# Patient Record
Sex: Female | Born: 1985 | Smoking: Never smoker
Health system: Southern US, Community
[De-identification: ages and names within clinical notes are randomized; demographics above are authoritative.]

## PROBLEM LIST (undated history)

## (undated) DIAGNOSIS — F419 Anxiety disorder, unspecified: Secondary | ICD-10-CM

## (undated) DIAGNOSIS — N809 Endometriosis, unspecified: Secondary | ICD-10-CM

## (undated) HISTORY — DX: Anxiety disorder, unspecified: F41.9

## (undated) HISTORY — DX: Endometriosis, unspecified: N80.9

## (undated) HISTORY — PX: LAPAROSCOPY: SHX197

---

## 2014-12-17 ENCOUNTER — Other Ambulatory Visit (HOSPITAL_COMMUNITY)
Admission: RE | Admit: 2014-12-17 | Discharge: 2014-12-17 | Disposition: A | Discharge status: Home / Self Care | Payer: BLUE CROSS/BLUE SHIELD | Source: Ambulatory Visit | Attending: Obstetrics & Gynecology | Admitting: Obstetrics & Gynecology

## 2014-12-17 DIAGNOSIS — Z01419 Encounter for gynecological examination (general) (routine) without abnormal findings: Secondary | ICD-10-CM | POA: Diagnosis present

## 2014-12-17 DIAGNOSIS — Z1151 Encounter for screening for human papillomavirus (HPV): Secondary | ICD-10-CM | POA: Insufficient documentation

## 2019-01-31 ENCOUNTER — Other Ambulatory Visit: Payer: Self-pay

## 2019-01-31 DIAGNOSIS — Z20822 Contact with and (suspected) exposure to covid-19: Secondary | ICD-10-CM

## 2019-02-02 LAB — NOVEL CORONAVIRUS, NAA: SARS-CoV-2, NAA: NOT DETECTED

## 2021-04-10 ENCOUNTER — Emergency Department (HOSPITAL_COMMUNITY): Payer: Self-pay

## 2021-04-10 ENCOUNTER — Emergency Department (HOSPITAL_COMMUNITY)
Admission: EM | Admit: 2021-04-10 | Discharge: 2021-04-11 | Disposition: A | Discharge status: Home / Self Care | Payer: Self-pay | Attending: Emergency Medicine | Admitting: Emergency Medicine

## 2021-04-10 ENCOUNTER — Encounter (HOSPITAL_COMMUNITY): Payer: Self-pay

## 2021-04-10 DIAGNOSIS — R739 Hyperglycemia, unspecified: Secondary | ICD-10-CM | POA: Insufficient documentation

## 2021-04-10 DIAGNOSIS — E871 Hypo-osmolality and hyponatremia: Secondary | ICD-10-CM | POA: Insufficient documentation

## 2021-04-10 DIAGNOSIS — N9489 Other specified conditions associated with female genital organs and menstrual cycle: Secondary | ICD-10-CM | POA: Insufficient documentation

## 2021-04-10 DIAGNOSIS — N949 Unspecified condition associated with female genital organs and menstrual cycle: Secondary | ICD-10-CM

## 2021-04-10 DIAGNOSIS — N23 Unspecified renal colic: Secondary | ICD-10-CM | POA: Insufficient documentation

## 2021-04-10 DIAGNOSIS — N131 Hydronephrosis with ureteral stricture, not elsewhere classified: Secondary | ICD-10-CM | POA: Insufficient documentation

## 2021-04-10 DIAGNOSIS — N838 Other noninflammatory disorders of ovary, fallopian tube and broad ligament: Secondary | ICD-10-CM | POA: Insufficient documentation

## 2021-04-10 DIAGNOSIS — R102 Pelvic and perineal pain: Secondary | ICD-10-CM

## 2021-04-10 LAB — URINALYSIS, ROUTINE W REFLEX MICROSCOPIC
Bacteria, UA: NONE SEEN
Bilirubin Urine: NEGATIVE
Glucose, UA: NEGATIVE mg/dL
Hgb urine dipstick: NEGATIVE
Ketones, ur: NEGATIVE mg/dL
Leukocytes,Ua: NEGATIVE
Nitrite: POSITIVE — AB
Protein, ur: 100 mg/dL — AB
Specific Gravity, Urine: 1.027 (ref 1.005–1.030)
pH: 5 (ref 5.0–8.0)

## 2021-04-10 LAB — CBC
HCT: 40.5 % (ref 36.0–46.0)
Hemoglobin: 13.8 g/dL (ref 12.0–15.0)
MCH: 29.9 pg (ref 26.0–34.0)
MCHC: 34.1 g/dL (ref 30.0–36.0)
MCV: 87.9 fL (ref 80.0–100.0)
Platelets: 395 10*3/uL (ref 150–400)
RBC: 4.61 MIL/uL (ref 3.87–5.11)
RDW: 11.7 % (ref 11.5–15.5)
WBC: 6.8 10*3/uL (ref 4.0–10.5)
nRBC: 0 % (ref 0.0–0.2)

## 2021-04-10 LAB — COMPREHENSIVE METABOLIC PANEL
ALT: 18 U/L (ref 0–44)
AST: 32 U/L (ref 15–41)
Albumin: 4.2 g/dL (ref 3.5–5.0)
Alkaline Phosphatase: 54 U/L (ref 38–126)
Anion gap: 10 (ref 5–15)
BUN: 13 mg/dL (ref 6–20)
CO2: 23 mmol/L (ref 22–32)
Calcium: 9.2 mg/dL (ref 8.9–10.3)
Chloride: 99 mmol/L (ref 98–111)
Creatinine, Ser: 1.14 mg/dL — ABNORMAL HIGH (ref 0.44–1.00)
GFR, Estimated: >60 mL/min (ref >60)
Glucose, Bld: 135 mg/dL — ABNORMAL HIGH (ref 70–99)
Potassium: 4.2 mmol/L (ref 3.5–5.1)
Sodium: 132 mmol/L — ABNORMAL LOW (ref 135–145)
Total Bilirubin: 1.4 mg/dL — ABNORMAL HIGH (ref 0.3–1.2)
Total Protein: 7.7 g/dL (ref 6.5–8.1)

## 2021-04-10 LAB — I-STAT BETA HCG BLOOD, ED (MC, WL, AP ONLY): I-stat hCG, quantitative: <5 m[IU]/mL (ref <5)

## 2021-04-10 IMAGING — US US PELVIS COMPLETE TRANSABD/TRANSVAG W DUPLEX AND/OR DOPPLER
1 series · 13 of 25 positions shown · non-contrast
Comparison: CT from earlier in the same day.

CLINICAL DATA: Cystic structure in the right hemipelvis on recent
CT

EXAM:
TRANSABDOMINAL AND TRANSVAGINAL ULTRASOUND OF PELVIS
DOPPLER ULTRASOUND OF OVARIES
TECHNIQUE: Both transabdominal and transvaginal ultrasound examinations of the
pelvis were performed. Transabdominal technique was performed for
global imaging of the pelvis including uterus, ovaries, adnexal
regions, and pelvic cul-de-sac.
It was necessary to proceed with endovaginal exam following the
transabdominal exam to visualize the ovaries. Color and duplex
Doppler ultrasound was utilized to evaluate blood flow to the
ovaries.

[Series 1: us pelvic doppler (torsion right/o or mass arteria · arterial · 13 of 45 slices shown]
[im 1/45]
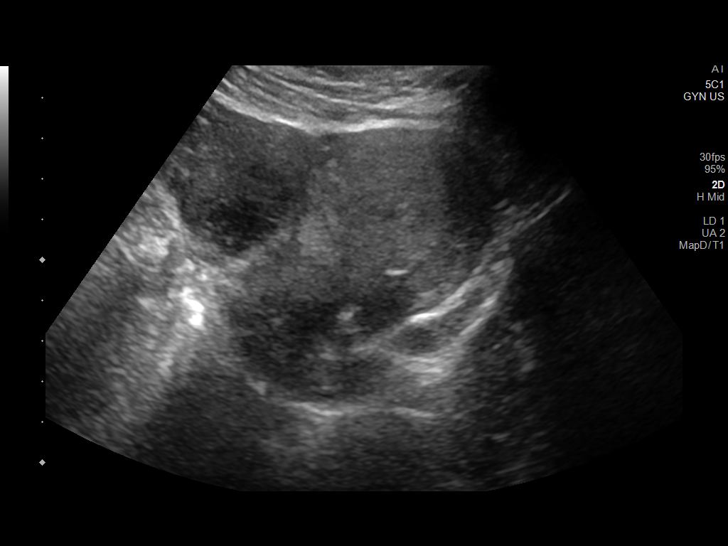
[im 4/45]
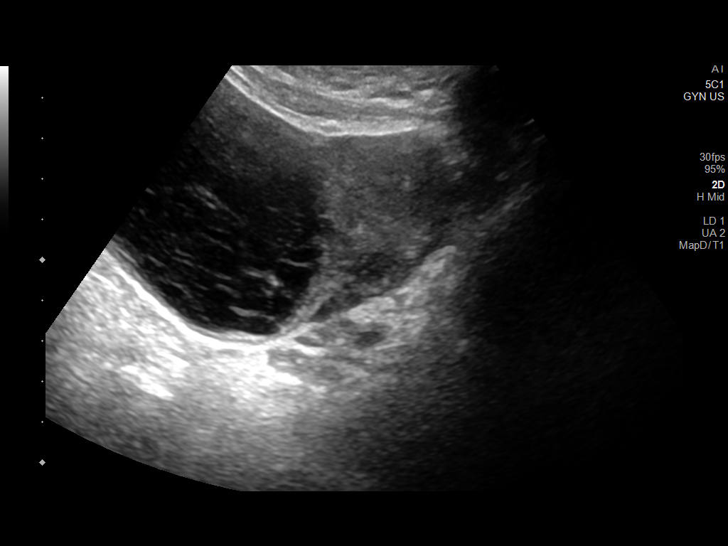
[im 8/45]
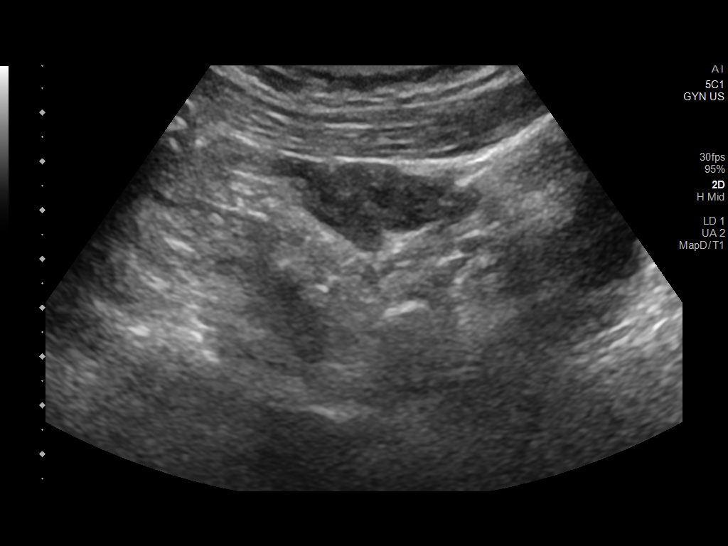
[im 12/45]
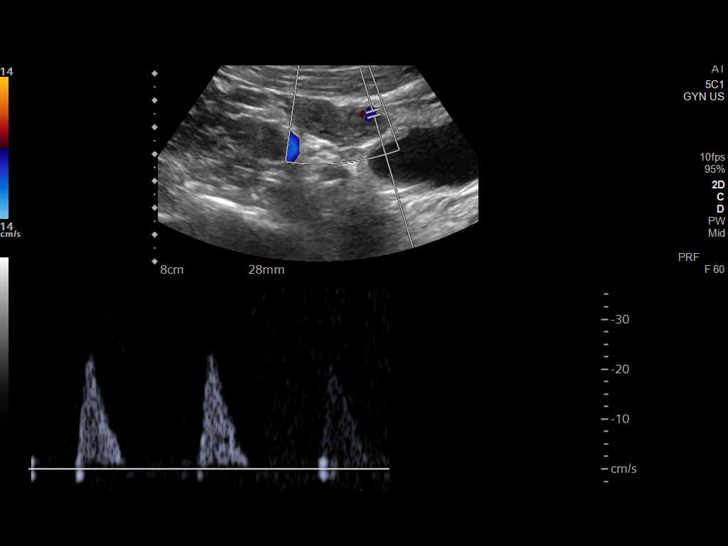
[im 15/45]
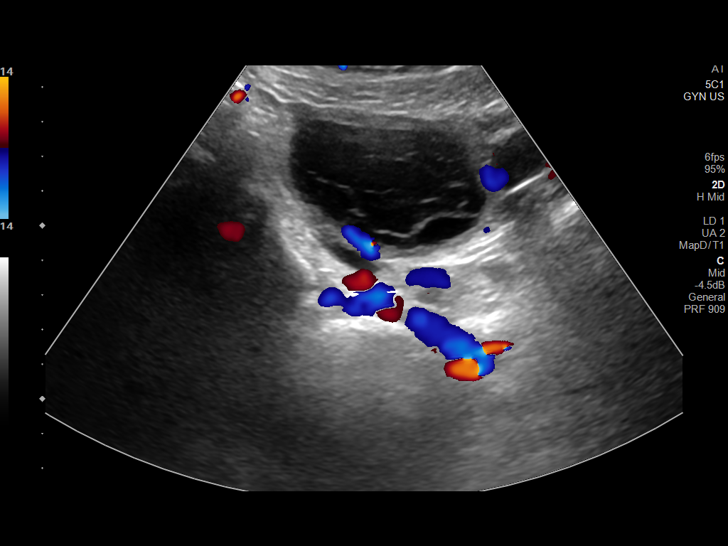
[im 19/45]
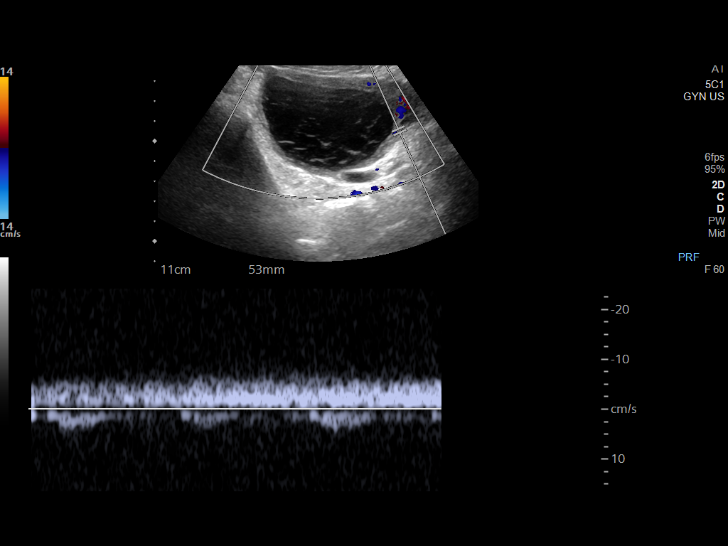
[im 23/45]
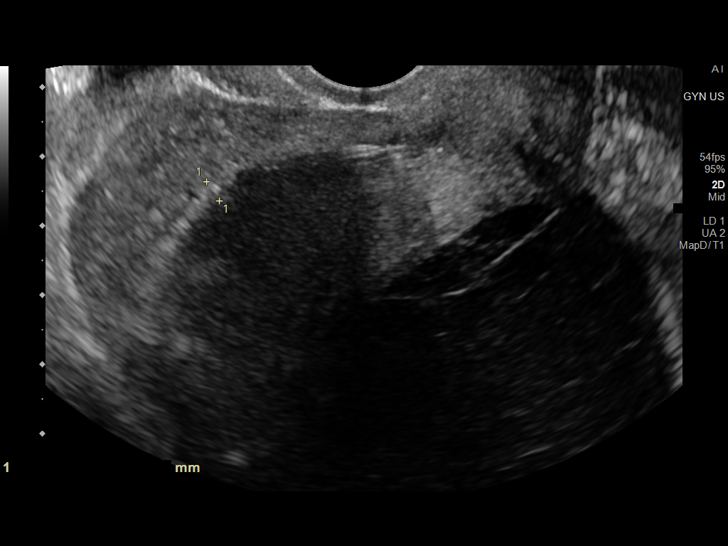
[im 26/45]
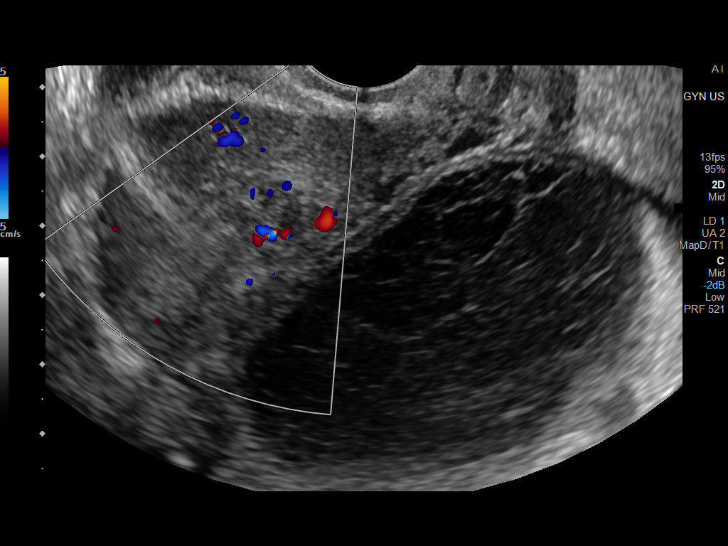
[im 30/45]
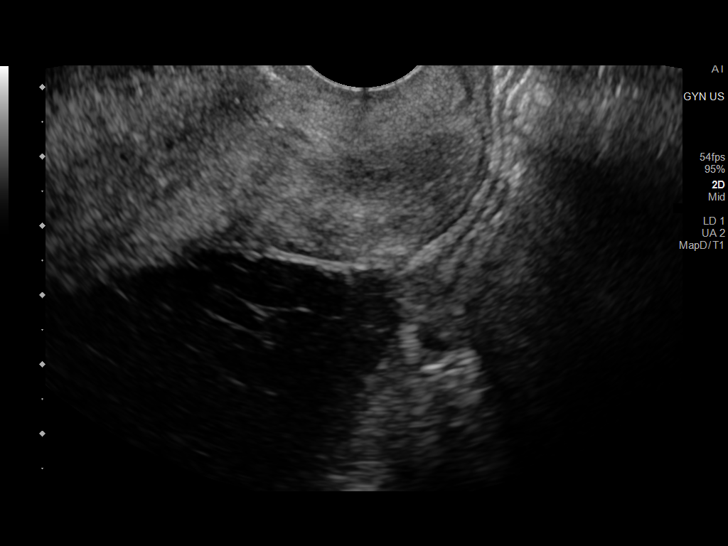
[im 34/45]
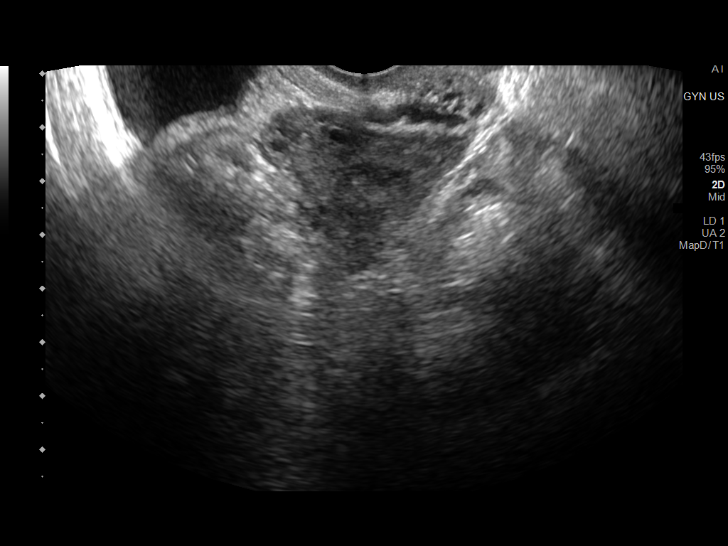
[im 37/45]
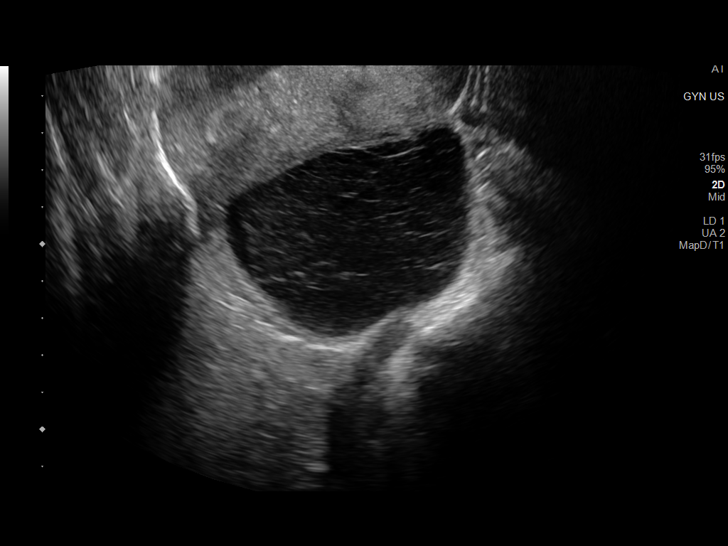
[im 41/45]
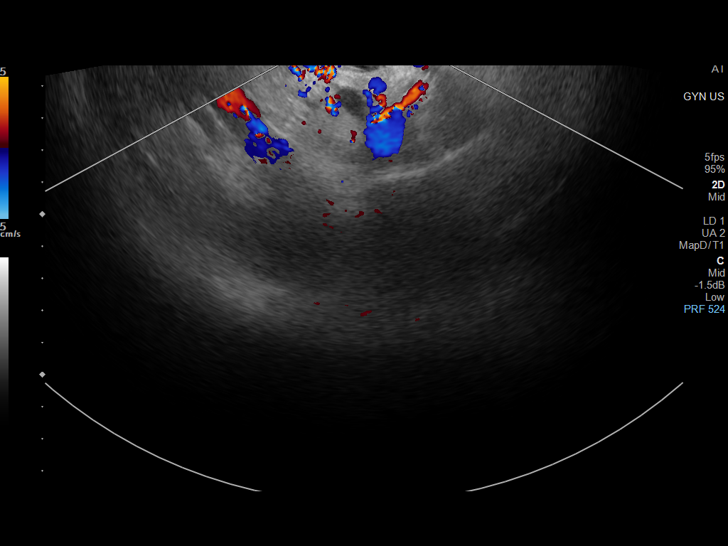
[im 45/45]
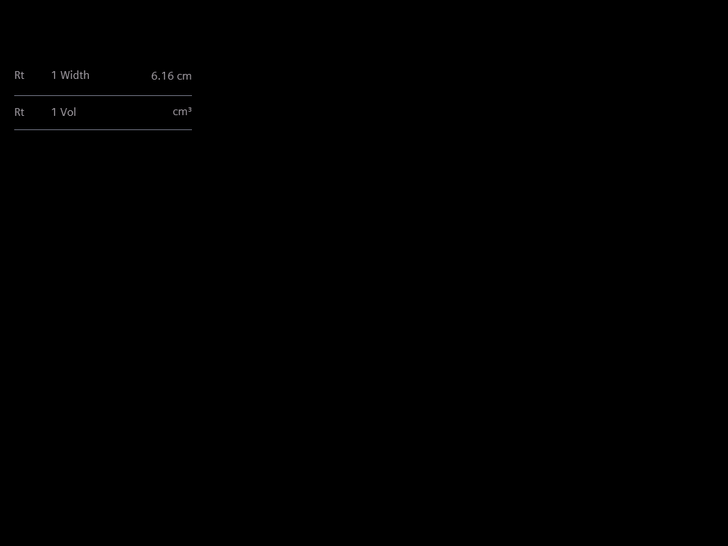

[13 of 25 positions shown; findings below may reference images not displayed]

FINDINGS: Uterus

Measurements: 8.1 x 4.4 x 6.0 cm. = volume: 110 mL. 1.5 cm right
lateral uterine fibroid is seen.

Endometrium

Thickness: 3.3 mm.  IUD is noted in place

Right ovary

Measurements: 7.8 x 5.2 x 7.2 cm. = volume: 153 mL. 6.6 x 4.6 x
cm hypoechoic lesion is noted with ENDRIGO internal structures
consistent with hemorrhagic cyst.

Left ovary

Measurements: 3.7 x 2.1 x 1.8 cm. = volume: 7.1 mL. Normal
appearance/no adnexal mass.

Pulsed Doppler evaluation of both ovaries demonstrates normal
low-resistance arterial and venous waveforms.

Other findings

No abnormal free fluid.
IMPRESSION: 6.6 cm hemorrhagic cyst in the right ovary. Short-interval follow up
ultrasound in 6-12 weeks is recommended, preferably during the week
following the patient's normal menses. If the lesion has not
resolved, annual follow-up is recommended.

Small uterine fibroid.

IUD in place.

## 2021-04-10 IMAGING — CT CT RENAL STONE PROTOCOL
2 of 4 series · 15 of 46 positions shown, 17 images · non-contrast
Comparison: None.

CLINICAL DATA: Right flank pain, kidney stone suspected.



[Series 3: renal stone 5.0 · axial · 0.84mm/px · z∈[-423,+17]mm · 12 of 102 slices shown, 14 images]
[im 9/102  soft-tissue]
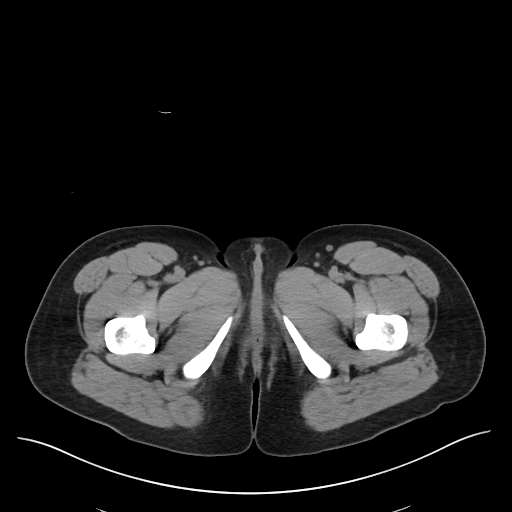
[im 9/102  bone]
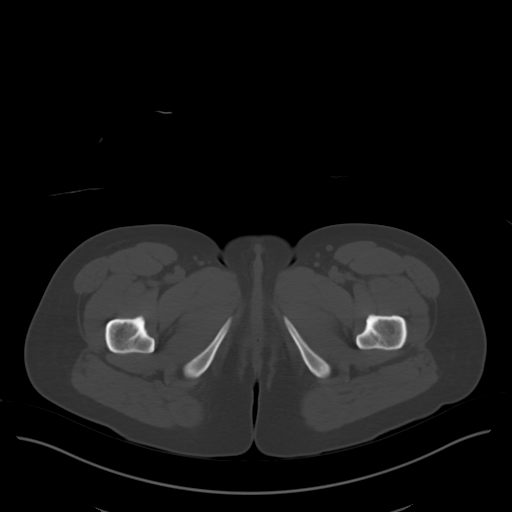
[im 17/102  soft-tissue]
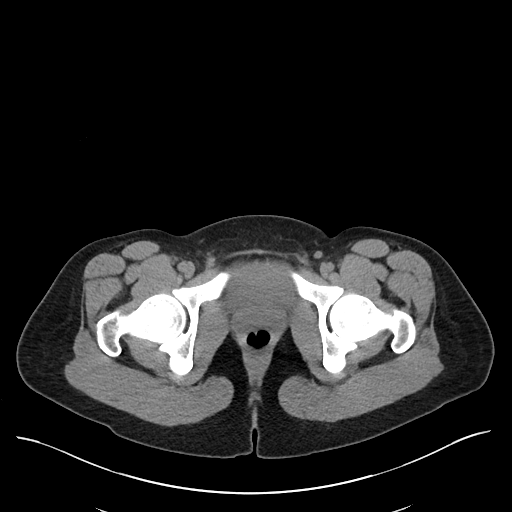
[im 25/102  soft-tissue]
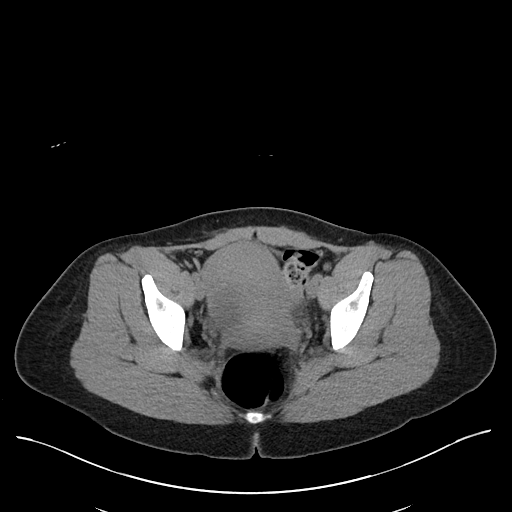
[im 33/102  soft-tissue]
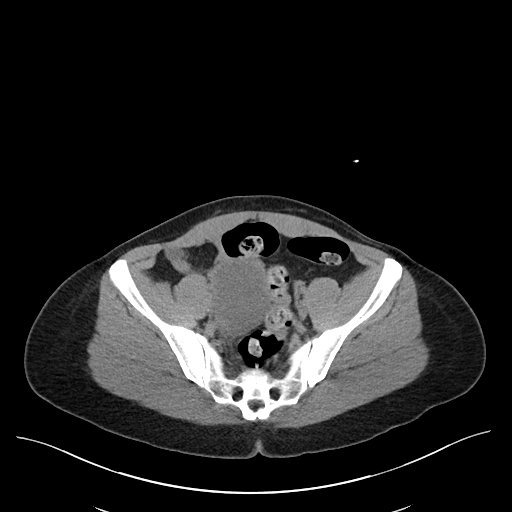
[im 41/102  soft-tissue]
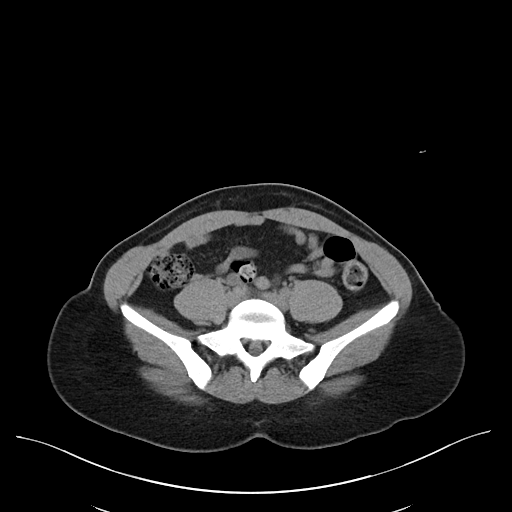
[im 49/102  soft-tissue]
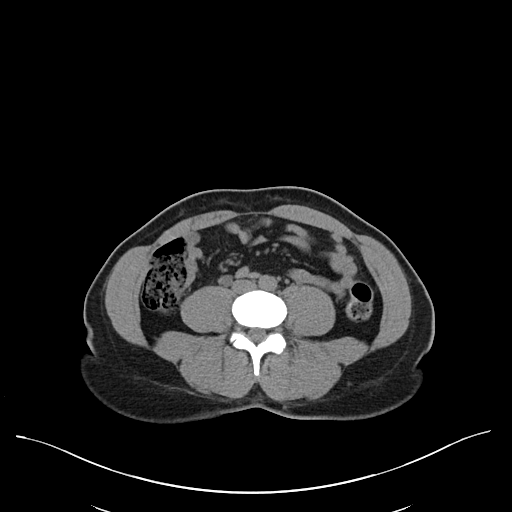
[im 57/102  soft-tissue]
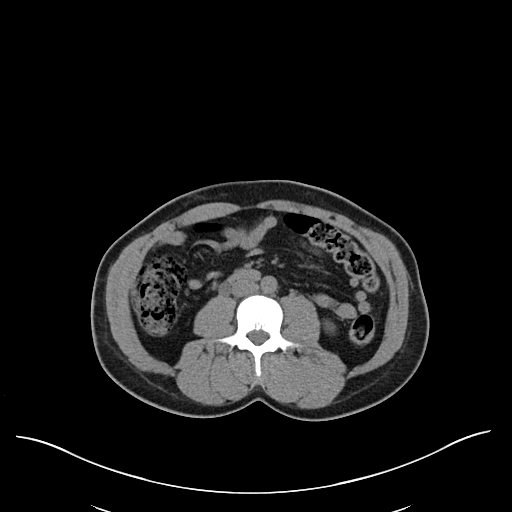
[im 65/102  soft-tissue]
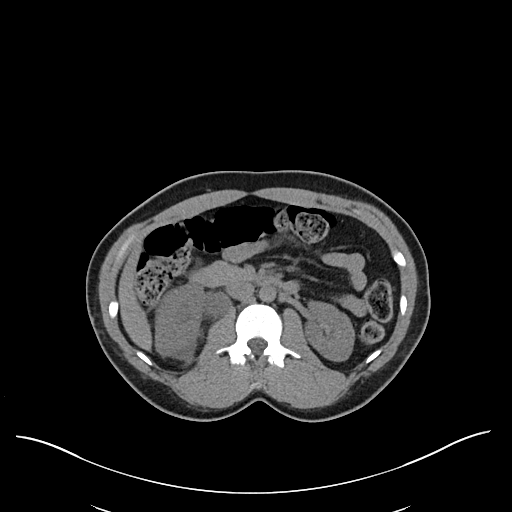
[im 73/102  soft-tissue]
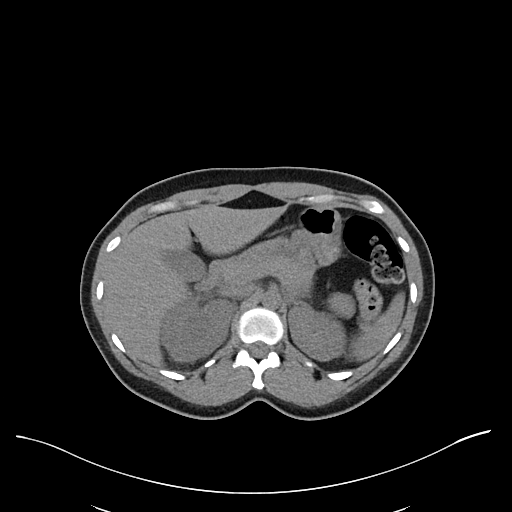
[im 73/102  bone]
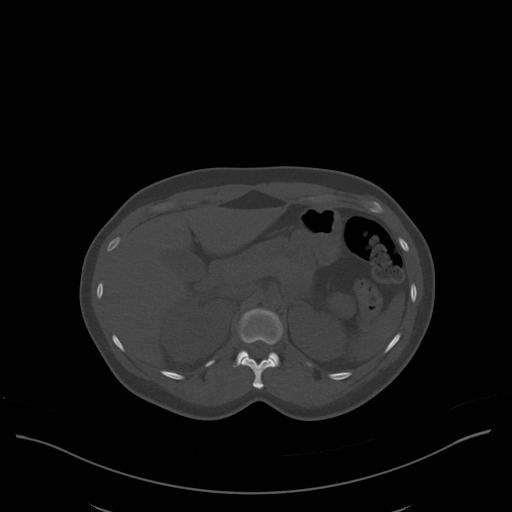
[im 81/102  soft-tissue]
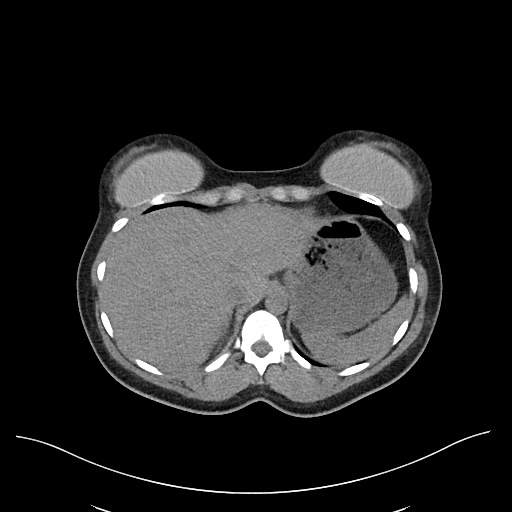
[im 89/102  soft-tissue]
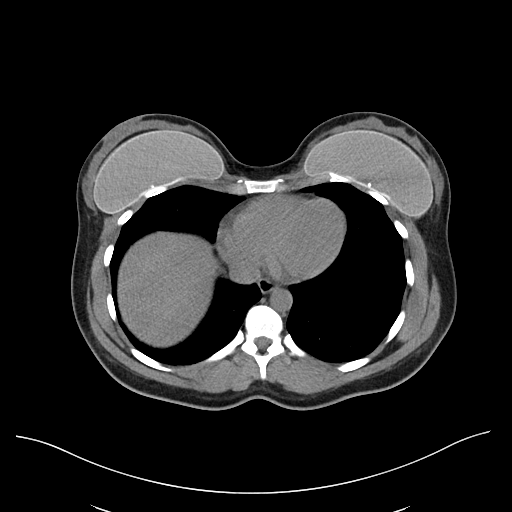
[im 97/102  soft-tissue]
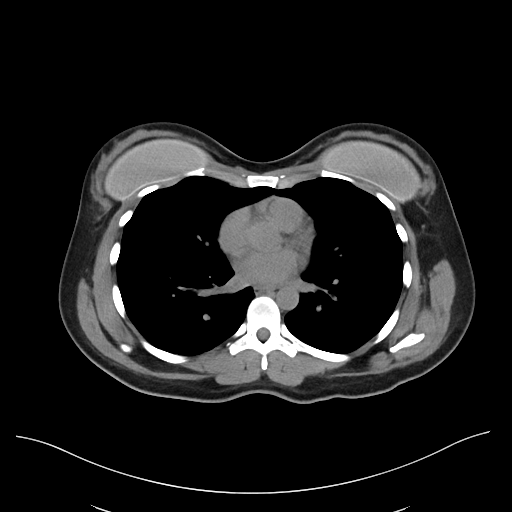

[Series 6: cor · coronal · 0.68mm/px · 3 of 142 slices shown]
[im 48/142  soft-tissue]
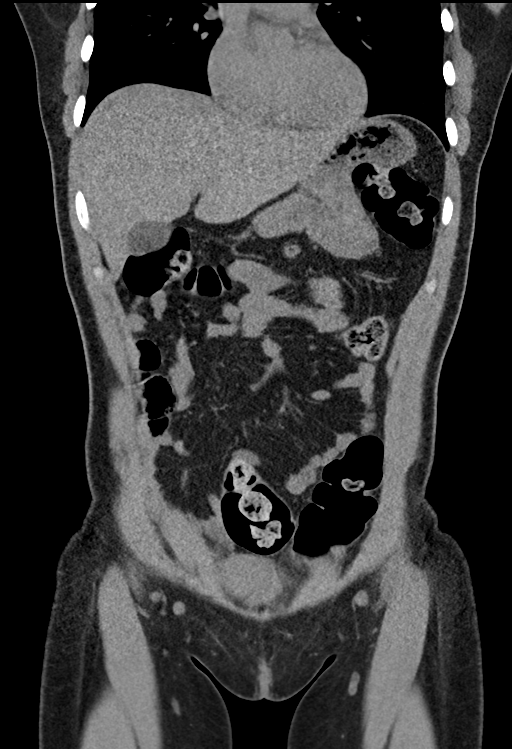
[im 63/142  soft-tissue]
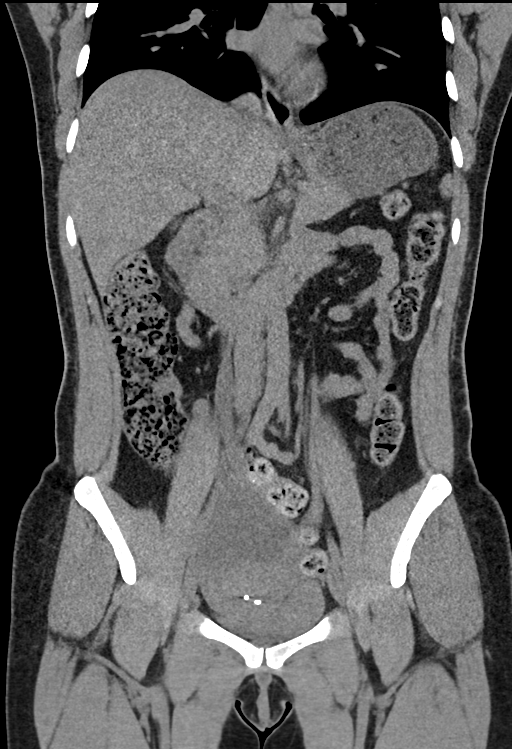
[im 79/142  soft-tissue]
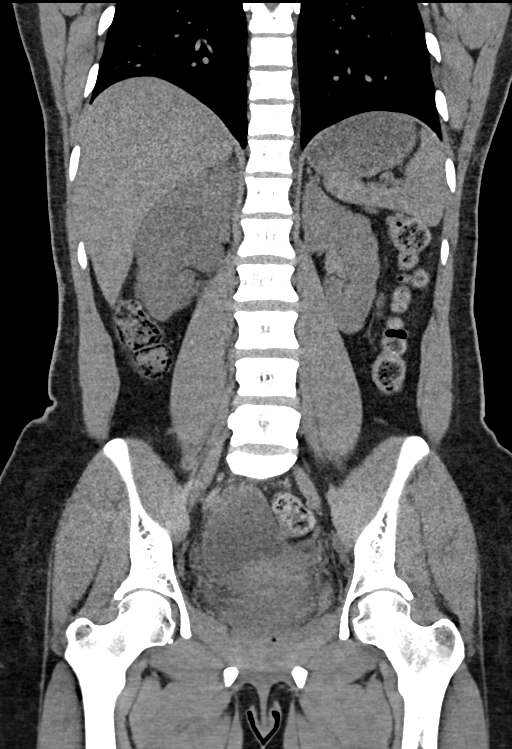

[15 of 46 positions shown; findings below may reference images not displayed]

FINDINGS: Lower chest: Dependent atelectasis is noted at the lung bases.

Hepatobiliary: No focal liver abnormality is seen. No gallstones,
gallbladder wall thickening, or biliary dilatation.

Pancreas: Unremarkable. No pancreatic ductal dilatation or
surrounding inflammatory changes.

Spleen: Normal in size without focal abnormality.

Adrenals/Urinary Tract: The adrenal glands are within normal limits.
No renal calculus is seen bilaterally. There is moderate
hydroureteronephrosis on the right with a 3 mm calculus in the
region of the ureterovesicular junction. The bladder is decompressed
and not well evaluated. No obstructive uropathy is noted on the
left.

Stomach/Bowel: The stomach is within normal limits. No bowel
obstruction, free air, or pneumatosis. The appendix is not
visualized on exam and surgical changes are noted at the cecum. No
focal bowel wall thickening.

Vascular/Lymphatic: No significant vascular findings are present. No
enlarged abdominal or pelvic lymph nodes.

Reproductive: An IUD is present in the uterus. There is a large
cystic structure in the right adnexa measuring 7.3 cm.

Other: No ascites. There is diastasis of the rectus abdominus with a
small broad-based fat containing umbilical hernia.

Musculoskeletal: Bilateral breast implants are noted. No acute
osseous abnormality.
IMPRESSION: 1. Moderate hydroureteronephrosis on the right with a 3 mm calculus
at the ureterovesicular junction.
2. Large cystic structure in the right adnexa measuring 7.3 cm.
Ultrasound is recommended for further evaluation.
3. The appendix is not visualized on exam and surgical changes are
noted at the cecum. Correlation with surgical history is suggested

## 2021-04-10 MED ORDER — KETOROLAC TROMETHAMINE 15 MG/ML IJ SOLN
15.0000 mg | Freq: Once | INTRAMUSCULAR | Status: AC
  Administered 2021-04-10: 15 mg via INTRAVENOUS
  Filled 2021-04-10: qty 1

## 2021-04-10 MED ORDER — TAMSULOSIN HCL 0.4 MG PO CAPS: 0.4000 mg | ORAL_CAPSULE | Freq: Every day | ORAL | 0 refills | Status: DC

## 2021-04-10 MED ORDER — ONDANSETRON HCL 4 MG/2ML IJ SOLN
4.0000 mg | Freq: Once | INTRAMUSCULAR | Status: AC
  Administered 2021-04-10: 4 mg via INTRAVENOUS
  Filled 2021-04-10: qty 2

## 2021-04-10 MED ORDER — HYDROMORPHONE HCL 1 MG/ML IJ SOLN
0.5000 mg | Freq: Once | INTRAMUSCULAR | Status: AC
  Administered 2021-04-10: 0.5 mg via INTRAVENOUS
  Filled 2021-04-10: qty 1

## 2021-04-10 MED ORDER — OXYCODONE HCL 5 MG PO TABS: 5.0000 mg | ORAL_TABLET | Freq: Four times a day (QID) | ORAL | 0 refills | Status: DC | PRN

## 2021-04-10 MED ORDER — OXYCODONE HCL 5 MG PO TABS
5.0000 mg | ORAL_TABLET | Freq: Once | ORAL | Status: AC
  Administered 2021-04-10: 5 mg via ORAL
  Filled 2021-04-10: qty 1

## 2021-04-10 MED ORDER — ONDANSETRON 4 MG PO TBDP: 4.0000 mg | ORAL_TABLET | Freq: Three times a day (TID) | ORAL | 0 refills | Status: DC | PRN

## 2021-04-10 NOTE — ED Notes (Signed)
Pt in ultrasound

## 2021-04-10 NOTE — ED Triage Notes (Signed)
Pt arrives POV for eval of R sided flank pain. Pt reports she noted dysuria, frequency onset this AM, took an Azo w/ relief. Now reports severe R sided flank pain. States also w/ some RLQ abd pain.

## 2021-04-10 NOTE — Discharge Instructions (Addendum)
Please read and follow all provided instructions.  Your diagnoses today include:  1. Ureteral colic   2. Pelvic pain   3. Adnexal cyst     Tests performed today include: Urine test that showed blood in your urine and no infection CT scan which showed a 3 millimeter kidney stone on the right side, also shows right-sided pelvic cyst Blood test that showed normal kidney function Ultrasound of the pelvis: shows hemorrhagic cyst, 6.6cm in size, on the right ovary.  Vital signs. See below for your results today.   Medications prescribed:  Oxycodone - narcotic pain medication  DO NOT drive or perform any activities that require you to be awake and alert because this medicine can make you drowsy.   Zofran (ondansetron) - for nausea and vomiting  Flomax (tamsulosin) - relaxes smooth muscle to help kidney stones pass  Take any prescribed medications only as directed.  Home care instructions:  Follow any educational materials contained in this packet.  Please double your fluid intake for the next several days. Strain your urine and save any stones that may pass.   BE VERY CAREFUL not to take multiple medicines containing Tylenol (also called acetaminophen). Doing so can lead to an overdose which can damage your liver and cause liver failure and possibly death.   Follow-up instructions: Please follow-up with your urologist or the urologist referral (provided on front page) in the next 1 week for further evaluation of your symptoms.  Return instructions:  If you need to return to the Emergency Department, go to [facility] and not [facility]. The urologists are located at [facility] and can better care for you at this location.  Please return to the Emergency Department if you experience worsening symptoms.  Please return if you develop fever or uncontrolled pain or vomiting. Please return if you have any other emergent concerns.  Additional Information:  Your vital  signs today were: BP 130/83    Pulse 79    Temp 98.7 F (37.1 C) (Oral)    Resp 18    Ht 5\' 2"  (1.575 m)    Wt 62.6 kg    SpO2 99%    BMI 25.24 kg/m  If your blood pressure (BP) was elevated above 135/85 this visit, please have this repeated by your doctor within one month. --------------

## 2021-04-10 NOTE — ED Provider Notes (Signed)
[facility] Provider Note   CSN: [id number] Arrival date & time: 04/10/21  [facility]     History  Chief Complaint  Patient presents with   Flank Pain    [name] is a 36 y.o. female.  Patient with history of appendectomy, history of kidney stone, history of exploratory laparoscopy for endometriosis --presents to the emergency department for evaluation of right-sided flank and abdominal pain starting around 2 PM today.  Pain has been severe.  Was acute onset.  She had dysuria but no obvious hematuria.  She has had vomiting.  No fevers, chest pain, shortness of breath or cough.  No diarrhea or blood in the stool.  No vaginal bleeding or discharge.  States that symptoms did not really remind her when she had a kidney stone before.      Home Medications Prior to Admission medications   Not on File      Allergies    Patient has no known allergies.    Review of Systems   Review of Systems  Physical Exam Updated Vital Signs BP (!) 140/92 (BP Location: Right Arm)    Pulse 85    Temp 98.7 F (37.1 C) (Oral)    Resp 16    Ht 5\' 2"  (1.575 m)    Wt 62.6 kg    SpO2 100%    BMI 25.24 kg/m  Physical Exam Vitals and nursing note reviewed.  Constitutional:      General: She is in acute distress (Appears uncomfortable).     Appearance: She is well-developed.  HENT:     Head: Normocephalic and atraumatic.     Right Ear: External ear normal.     Left Ear: External ear normal.     Nose: Nose normal.  Eyes:     Conjunctiva/sclera: Conjunctivae normal.  Cardiovascular:     Rate and Rhythm: Normal rate and regular rhythm.     Heart sounds: No murmur heard. Pulmonary:     Effort: No respiratory distress.     Breath sounds: No wheezing, rhonchi or rales.  Abdominal:     Palpations: Abdomen is soft.     Tenderness: There is abdominal tenderness. There is no guarding or rebound.     Comments: Patient with right upper and right lower  quadrant tenderness to palpation with radiation to the right flank.  Musculoskeletal:     Cervical back: Normal range of motion and neck supple.     Right lower leg: No edema.     Left lower leg: No edema.  Skin:    General: Skin is warm and dry.     Findings: No rash.  Neurological:     General: No focal deficit present.     Mental Status: She is alert. Mental status is at baseline.     Motor: No weakness.  Psychiatric:        Mood and Affect: Mood normal.    ED Results / Procedures / Treatments   Labs (all labs ordered are listed, but only abnormal results are displayed) Labs Reviewed  URINALYSIS, ROUTINE W REFLEX MICROSCOPIC - Abnormal; Notable for the following components:      Result Value   Color, Urine AMBER (*)    Protein, ur 100 (*)    Nitrite POSITIVE (*)    All other components within normal limits  COMPREHENSIVE METABOLIC PANEL - Abnormal; Notable for the following components:   Sodium 132 (*)    Glucose, Bld 135 (*)  Creatinine, Ser 1.14 (*)    Total Bilirubin 1.4 (*)    All other components within normal limits  CBC  I-STAT BETA HCG BLOOD, ED ([facility], [facility], [facility] ONLY)    EKG None  Radiology CT Renal Stone Study  Result Date: 04/10/2021 CLINICAL DATA:  Right flank pain, kidney stone suspected. EXAM: CT ABDOMEN AND PELVIS WITHOUT CONTRAST TECHNIQUE: Multidetector CT imaging of the abdomen and pelvis was performed following the standard protocol without IV contrast. RADIATION DOSE REDUCTION: This exam was performed according to the departmental dose-optimization program which includes automated exposure control, adjustment of the mA and/or kV according to patient size and/or use of iterative reconstruction technique. COMPARISON:  None. FINDINGS: Lower chest: Dependent atelectasis is noted at the lung bases. Hepatobiliary: No focal liver abnormality is seen. No gallstones, gallbladder wall thickening, or biliary dilatation. Pancreas: Unremarkable. No pancreatic ductal  dilatation or surrounding inflammatory changes. Spleen: Normal in size without focal abnormality. Adrenals/Urinary Tract: The adrenal glands are within normal limits. No renal calculus is seen bilaterally. There is moderate hydroureteronephrosis on the right with a 3 mm calculus in the region of the ureterovesicular junction. The bladder is decompressed and not well evaluated. No obstructive uropathy is noted on the left. Stomach/Bowel: The stomach is within normal limits. No bowel obstruction, free air, or pneumatosis. The appendix is not visualized on exam and surgical changes are noted at the cecum. No focal bowel wall thickening. Vascular/Lymphatic: No significant vascular findings are present. No enlarged abdominal or pelvic lymph nodes. Reproductive: An IUD is present in the uterus. There is a large cystic structure in the right adnexa measuring 7.3 cm. Other: No ascites. There is diastasis of the rectus abdominus with a small broad-based fat containing umbilical hernia. Musculoskeletal: Bilateral breast implants are noted. No acute osseous abnormality. IMPRESSION: 1. Moderate hydroureteronephrosis on the right with a 3 mm calculus at the ureterovesicular junction. 2. Large cystic structure in the right adnexa measuring 7.3 cm. Ultrasound is recommended for further evaluation. 3. The appendix is not visualized on exam and surgical changes are noted at the cecum. Correlation with surgical history is suggested Electronically Signed   By: Brett Fairy M.D.   On: 04/10/2021 22:16    Procedures Procedures    Medications Ordered in ED Medications  HYDROmorphone (DILAUDID) injection 0.5 mg (has no administration in time range)  ketorolac (TORADOL) 15 MG/ML injection 15 mg (has no administration in time range)  HYDROmorphone (DILAUDID) injection 0.5 mg (0.5 mg Intravenous Given 04/10/21 1952)  ondansetron (ZOFRAN) injection 4 mg (4 mg Intravenous Given 04/10/21 1951)    ED Course/ Medical Decision Making/  A&P    Patient seen and examined. History obtained directly from patient.   Labs/EKG: Ordered CBC, CMP, UA, urine pregnancy.  Imaging: Ordered CT renal protocol.  Medications/Fluids: Ordered: Dilaudid and Zofran.   Most recent vital signs reviewed and are as follows: BP (!) 140/92 (BP Location: Right Arm)    Pulse 85    Temp 98.7 F (37.1 C) (Oral)    Resp 16    Ht 5\' 2"  (1.575 m)    Wt 62.6 kg    SpO2 100%    BMI 25.24 kg/m   Initial impression: R-sided abd pain, flank pain, hematuria, concern for ureteral colic.  10:45 PM Reassessment performed. Patient appears more comfortable now.   Labs and imaging to this point personally reviewed and interpreted including: CBC normal; CMP with slightly low sodium, slightly elevated glucose and slightly elevated creatinine; negative pregnancy; UA  with blood.  CT imaging showing hydronephrosis, 3 mm stone, right adnexal cystic structure.  Reviewed pertinent lab work and imaging with patient at bedside including: Labs, urine, CT results.  Offered ultrasound to further evaluate right adnexal cystic structure versus follow-up with her OB/GYN.  She states that she has an appointment about 2 weeks with GYN, however would like to pursue ultrasound tonight.  Will give Zofran for nausea and a dose of oral oxycodone.  Most current vital signs reviewed and are as follows: BP 130/83    Pulse 79    Temp 98.7 F (37.1 C) (Oral)    Resp 18    Ht 5\' 2"  (1.575 m)    Wt 62.6 kg    SpO2 99%    BMI 25.24 kg/m   Plan: Continued symptom control, eventual discharge to home, ultrasound prior to arrival to evaluate cystic structure seen on CT scan and rule out any torsion which I think is unlikely as kidney stone is more likely tonight.  11:33 PM Reassessment performed. Patient appears comfortable.   Labs and imaging personally reviewed and interpreted including: Ultrasound demonstrating ovarian cyst, reported no torsion.  Reviewed additional pertinent lab work and  imaging with patient at bedside including: Ultrasound results.  Most current vital signs reviewed and are as follows: BP 130/83    Pulse 79    Temp 98.7 F (37.1 C) (Oral)    Resp 18    Ht 5\' 2"  (1.575 m)    Wt 62.6 kg    SpO2 99%    BMI 25.24 kg/m   Plan: Discharge to home, urology follow-up  Home treatment: Prescription written for oxycodone, Zofran, Flomax. Patient counseled on use of narcotic pain medications. Counseled not to combine these medications with others containing tylenol. Urged not to drink alcohol, drive, or perform any other activities that requires focus while taking these medications. The patient verbalizes understanding and agrees with the plan.  Patient counseled on kidney stone treatment. Urged patient to strain urine and save any stones. Urged urology follow-up and return to [facility] with any complications. Counseled patient to maintain good fluid intake.   Counseled patient on use of Flomax.                           Medical Decision Making Amount and/or Complexity of Data Reviewed Labs: ordered. Radiology: ordered. ECG/medicine tests: ordered.  Risk Prescription drug management.   For this patient's complaint of abdominal pain, the following conditions were considered on the differential diagnosis: gastritis/PUD, enteritis/duodenitis, appendicitis (previous appendectomy), cholelithiasis/cholecystitis, cholangitis, pancreatitis, ruptured viscus, colitis, diverticulitis, proctitis, cystitis, pyelonephritis, ureteral colic, aortic dissection, aortic aneurysm. In women, ectopic pregnancy, pelvic inflammatory disease, ovarian cysts, and tubo-ovarian abscess were also considered. Atypical chest etiologies were also considered including ACS, PE, and pneumonia.   Work-up tonight shows right-sided hydroureteronephrosis with UVJ stone.  Symptoms controlled.  Patient has a large hemorrhagic cyst on the right ovary which was evaluated ultrasound.  No torsion.  She will follow-up  with GYN regarding this.  The patient's vital signs, pertinent lab work and imaging were reviewed and interpreted as discussed in the ED course. Hospitalization was considered for further testing, treatments, or serial exams/observation. However as patient is well-appearing, has a stable exam, and reassuring studies today, I do not feel that they warrant admission at this time. This plan was discussed with the patient who verbalizes agreement and comfort with this plan and seems reliable and able to return  to the Emergency Department with worsening or changing symptoms.          Final Clinical Impression(s) / ED Diagnoses Final diagnoses:  Ureteral colic  Adnexal cyst    Rx / DC Orders ED Discharge Orders          Ordered    oxyCODONE (OXY IR/ROXICODONE) 5 MG immediate release tablet  Every 6 hours PRN        04/10/21 2326    ondansetron (ZOFRAN-ODT) 4 MG disintegrating tablet  Every 8 hours PRN        04/10/21 2326    tamsulosin (FLOMAX) 0.4 MG CAPS capsule  Daily        04/10/21 2326              [facility], PA-C 04/10/21 2335    Christina Fast, MD 04/22/21 1218

## 2021-04-12 ENCOUNTER — Encounter (HOSPITAL_COMMUNITY): Payer: Self-pay

## 2021-05-13 ENCOUNTER — Emergency Department (HOSPITAL_COMMUNITY): Payer: Self-pay

## 2021-05-13 ENCOUNTER — Encounter (HOSPITAL_COMMUNITY): Payer: Self-pay | Admitting: Pharmacy Technician

## 2021-05-13 ENCOUNTER — Other Ambulatory Visit: Payer: Self-pay

## 2021-05-13 ENCOUNTER — Emergency Department (HOSPITAL_COMMUNITY)
Admission: EM | Admit: 2021-05-13 | Discharge: 2021-05-13 | Disposition: A | Discharge status: Home / Self Care | Payer: Self-pay | Attending: Emergency Medicine | Admitting: Emergency Medicine

## 2021-05-13 DIAGNOSIS — R0602 Shortness of breath: Secondary | ICD-10-CM | POA: Insufficient documentation

## 2021-05-13 DIAGNOSIS — M549 Dorsalgia, unspecified: Secondary | ICD-10-CM | POA: Insufficient documentation

## 2021-05-13 DIAGNOSIS — R0789 Other chest pain: Secondary | ICD-10-CM | POA: Insufficient documentation

## 2021-05-13 DIAGNOSIS — R002 Palpitations: Secondary | ICD-10-CM | POA: Insufficient documentation

## 2021-05-13 DIAGNOSIS — M542 Cervicalgia: Secondary | ICD-10-CM | POA: Insufficient documentation

## 2021-05-13 DIAGNOSIS — M79602 Pain in left arm: Secondary | ICD-10-CM | POA: Insufficient documentation

## 2021-05-13 DIAGNOSIS — R110 Nausea: Secondary | ICD-10-CM | POA: Insufficient documentation

## 2021-05-13 DIAGNOSIS — R531 Weakness: Secondary | ICD-10-CM | POA: Insufficient documentation

## 2021-05-13 LAB — BASIC METABOLIC PANEL
Anion gap: 9 (ref 5–15)
BUN: 10 mg/dL (ref 6–20)
CO2: 25 mmol/L (ref 22–32)
Calcium: 9.8 mg/dL (ref 8.9–10.3)
Chloride: 104 mmol/L (ref 98–111)
Creatinine, Ser: 0.79 mg/dL (ref 0.44–1.00)
GFR, Estimated: >60 mL/min (ref >60)
Glucose, Bld: 142 mg/dL — ABNORMAL HIGH (ref 70–99)
Potassium: 3.7 mmol/L (ref 3.5–5.1)
Sodium: 138 mmol/L (ref 135–145)

## 2021-05-13 LAB — I-STAT CHEM 8, ED
BUN: 13 mg/dL (ref 6–20)
Calcium, Ion: 1.29 mmol/L (ref 1.15–1.40)
Chloride: 102 mmol/L (ref 98–111)
Creatinine, Ser: 0.8 mg/dL (ref 0.44–1.00)
Glucose, Bld: 138 mg/dL — ABNORMAL HIGH (ref 70–99)
HCT: 44 % (ref 36.0–46.0)
Hemoglobin: 15 g/dL (ref 12.0–15.0)
Potassium: 3.6 mmol/L (ref 3.5–5.1)
Sodium: 140 mmol/L (ref 135–145)
TCO2: 27 mmol/L (ref 22–32)

## 2021-05-13 LAB — CBC WITH DIFFERENTIAL/PLATELET
Abs Immature Granulocytes: 0.02 10*3/uL (ref 0.00–0.07)
Basophils Absolute: 0 10*3/uL (ref 0.0–0.1)
Basophils Relative: 1 %
Eosinophils Absolute: 0.1 10*3/uL (ref 0.0–0.5)
Eosinophils Relative: 1 %
HCT: 41.7 % (ref 36.0–46.0)
Hemoglobin: 14.3 g/dL (ref 12.0–15.0)
Immature Granulocytes: 0 %
Lymphocytes Relative: 57 %
Lymphs Abs: 3.2 10*3/uL (ref 0.7–4.0)
MCH: 30.4 pg (ref 26.0–34.0)
MCHC: 34.3 g/dL (ref 30.0–36.0)
MCV: 88.7 fL (ref 80.0–100.0)
Monocytes Absolute: 0.3 10*3/uL (ref 0.1–1.0)
Monocytes Relative: 5 %
Neutro Abs: 2.1 10*3/uL (ref 1.7–7.7)
Neutrophils Relative %: 36 %
Platelets: 393 10*3/uL (ref 150–400)
RBC: 4.7 MIL/uL (ref 3.87–5.11)
RDW: 11.7 % (ref 11.5–15.5)
WBC: 5.7 10*3/uL (ref 4.0–10.5)
nRBC: 0 % (ref 0.0–0.2)

## 2021-05-13 LAB — TROPONIN I (HIGH SENSITIVITY)
Troponin I (High Sensitivity): <2 ng/L (ref <18)
Troponin I (High Sensitivity): <2 ng/L (ref <18)

## 2021-05-13 LAB — I-STAT BETA HCG BLOOD, ED (MC, WL, AP ONLY): I-stat hCG, quantitative: <5 m[IU]/mL (ref <5)

## 2021-05-13 IMAGING — MR MR HEAD WO/W CM
7 of 13 series · 23 of 48 positions shown · IV contrast (gadavist)
Comparison: None.

CLINICAL DATA: Acute neurologic deficit

EXAM:
MRI HEAD WITHOUT AND WITH CONTRAST
MRI CERVICAL SPINE WITHOUT AND WITH CONTRAST
TECHNIQUE: Multiplanar, multiecho pulse sequences of the brain and surrounding
structures, and cervical spine, to include the craniocervical
junction and cervicothoracic junction, were obtained without and
with intravenous contrast.
CONTRAST:  6.5mL GADAVIST GADOBUTROL 1 MMOL/ML IV SOLN

[Series 2: DWI · axial · 3.0mm · 0.94mm/px · z∈[-61,+92]mm · 8 of 106 slices shown (1 of 2)]
[im 1/106]
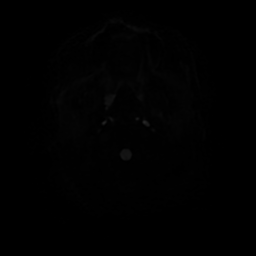
[im 16/106]
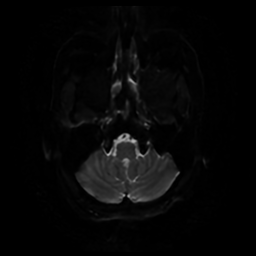
[im 31/106]
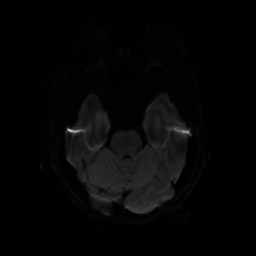
[im 46/106]
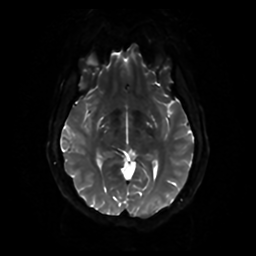
[im 61/106]
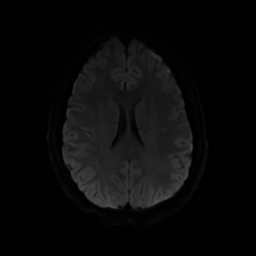
[im 76/106]
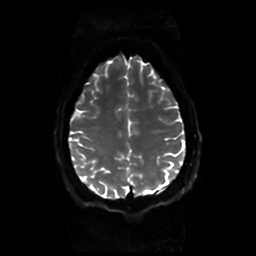
[im 91/106]
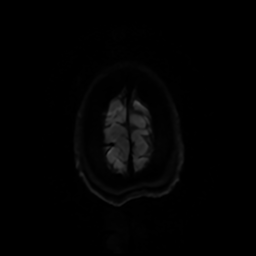
[im 106/106]
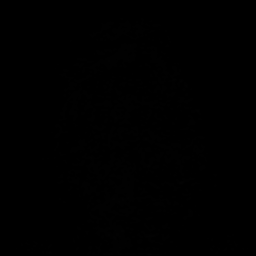

[Series 3: DWI · coronal · 4.0mm · 0.94mm/px · 5 of 74 slices shown (2 of 2)]
[im 1/74]
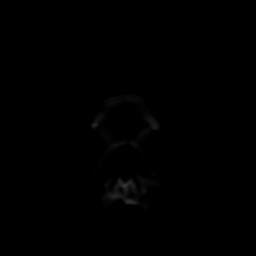
[im 19/74]
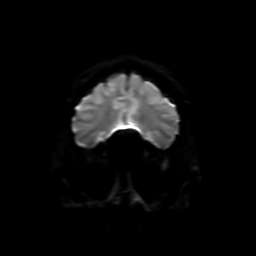
[im 37/74]
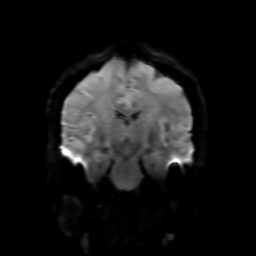
[im 55/74]
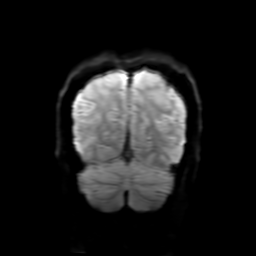
[im 74/74]
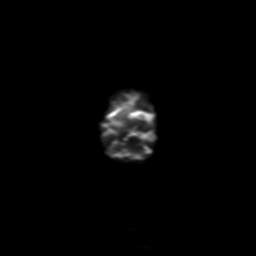

[Series 4: FLAIR · sagittal · 5.0mm · 0.23mm/px · 2 of 25 slices shown (1 of 2)]
[im 1/25]
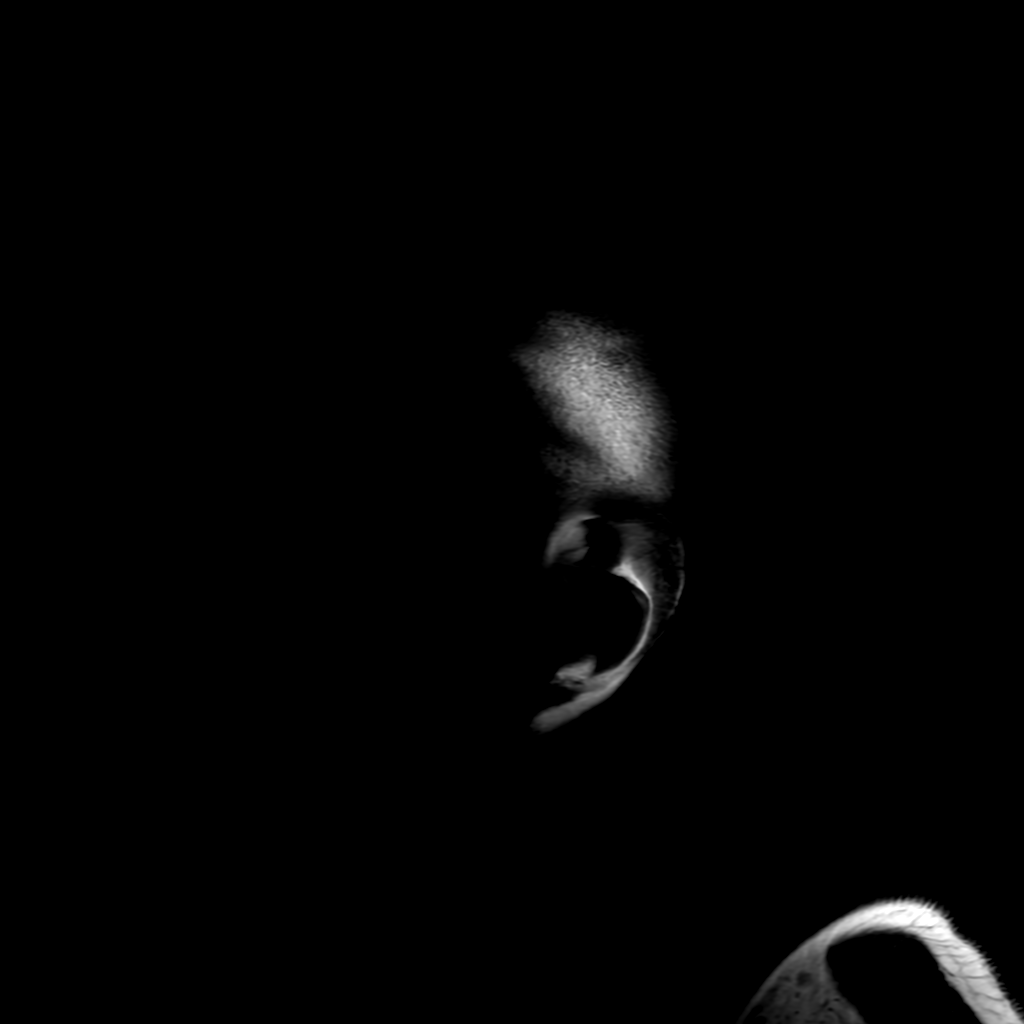
[im 25/25]
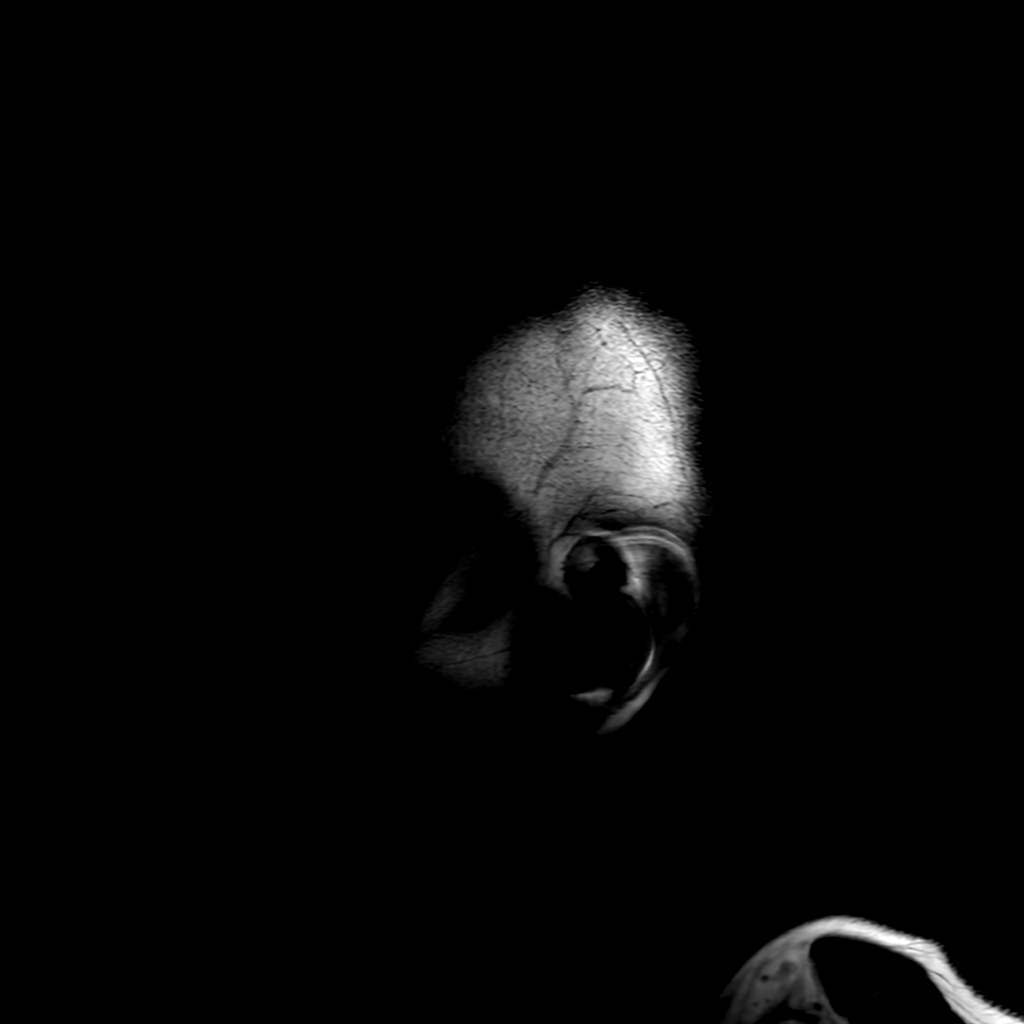

[Series 5: T2 · axial · 5.0mm · 0.23mm/px · 1 of 27 slices shown]
[im 1/27]
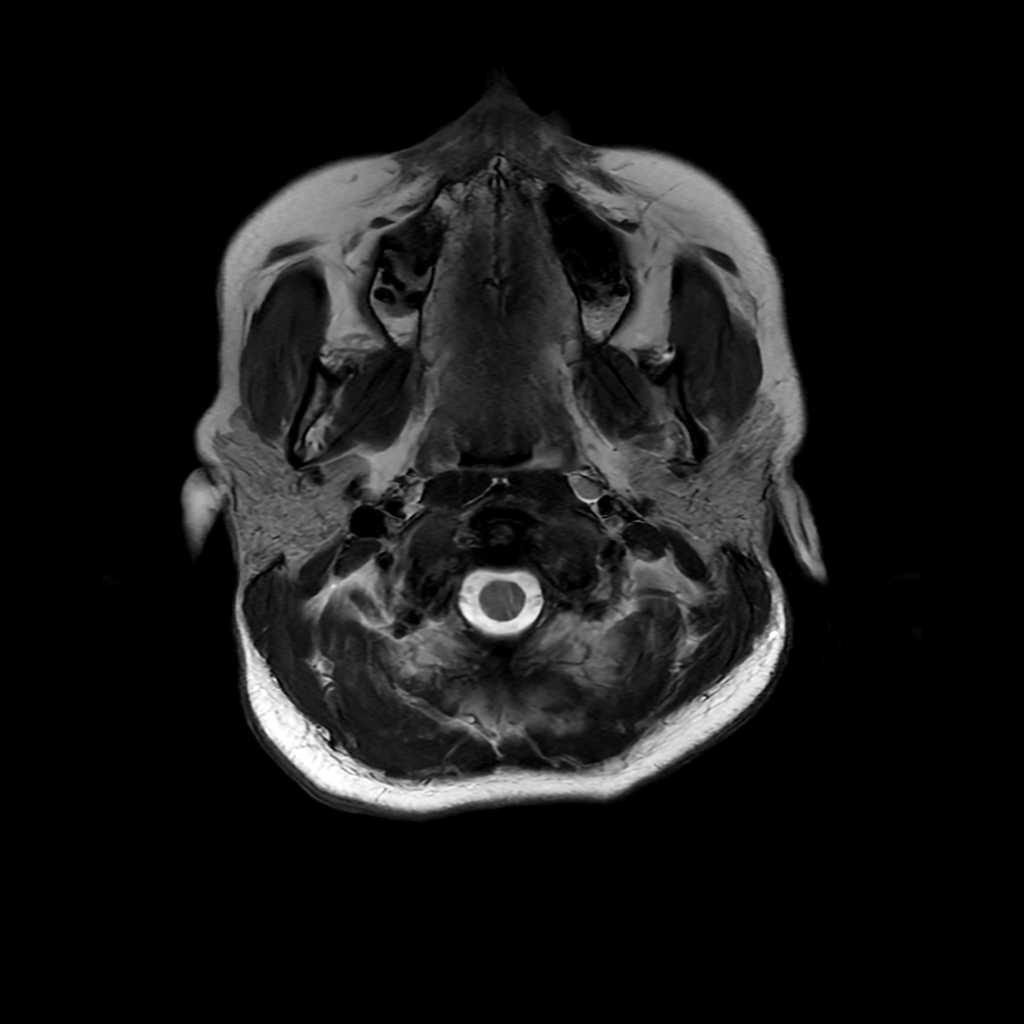

[Series 6: FLAIR · axial · 4.0mm · 0.45mm/px · z∈[-56,+96]mm · 2 of 36 slices shown (2 of 2)]
[im 1/36]
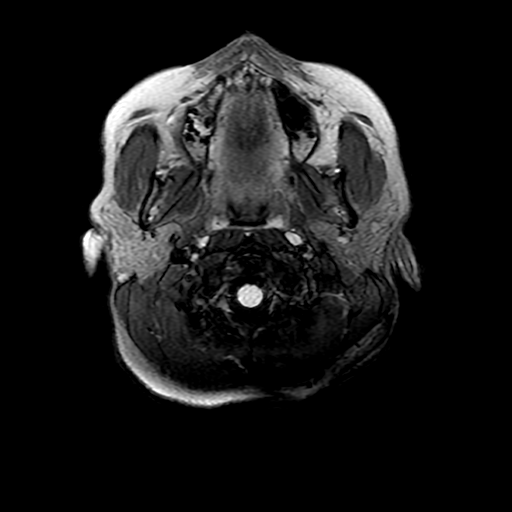
[im 36/36]
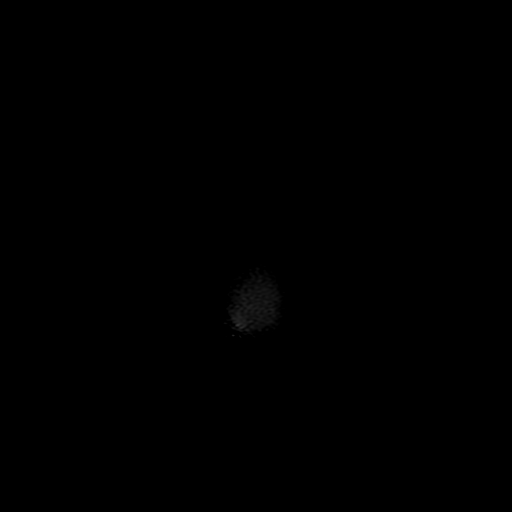

[Series 250: ADC · axial · 3.0mm · 0.94mm/px · z∈[-61,+92]mm · 3 of 53 slices shown (1 of 2)]
[im 1/53]
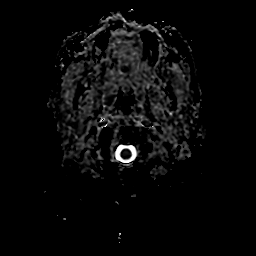
[im 27/53]
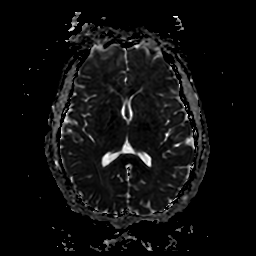
[im 53/53]
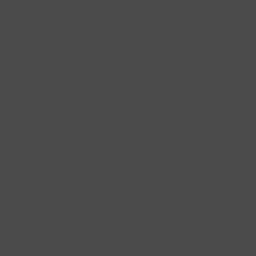

[Series 350: ADC · coronal · 4.0mm · 0.94mm/px · 2 of 37 slices shown (2 of 2)]
[im 1/37]
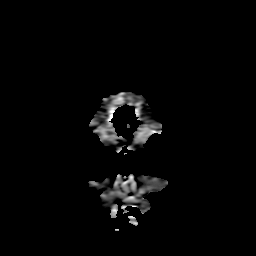
[im 37/37]
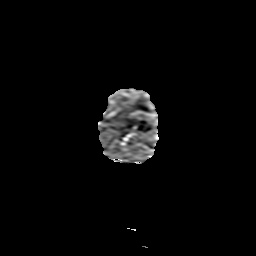

[23 of 48 positions shown; findings below may reference images not displayed]

FINDINGS: MRI HEAD FINDINGS

Brain: No acute infarct, mass effect or extra-axial collection. No
acute or chronic hemorrhage. Normal white matter signal, parenchymal
volume and CSF spaces. The midline structures are normal.

Vascular: Major flow voids are preserved.

Skull and upper cervical spine: Normal calvarium and skull base.
Visualized upper cervical spine and soft tissues are normal.

Sinuses/Orbits:No paranasal sinus fluid levels or advanced mucosal
thickening. No mastoid or middle ear effusion. Normal orbits.

MRI CERVICAL SPINE FINDINGS

Alignment: Physiologic.

Vertebrae: No fracture, evidence of discitis, or bone lesion. C7
hemangioma.

Cord: Normal signal and morphology.

Posterior Fossa, vertebral arteries, paraspinal tissues: Negative.

Disc levels: No disc herniation, spinal canal stenosis or neural
foraminal stenosis.
IMPRESSION: Normal MRI of the brain and cervical spine.

## 2021-05-13 IMAGING — DX DG CHEST 1V PORT
1 series · 1 of 1 positions shown · non-contrast
Comparison: None.

CLINICAL DATA: Chest pain

EXAM:
PORTABLE CHEST 1 VIEW

[chest]
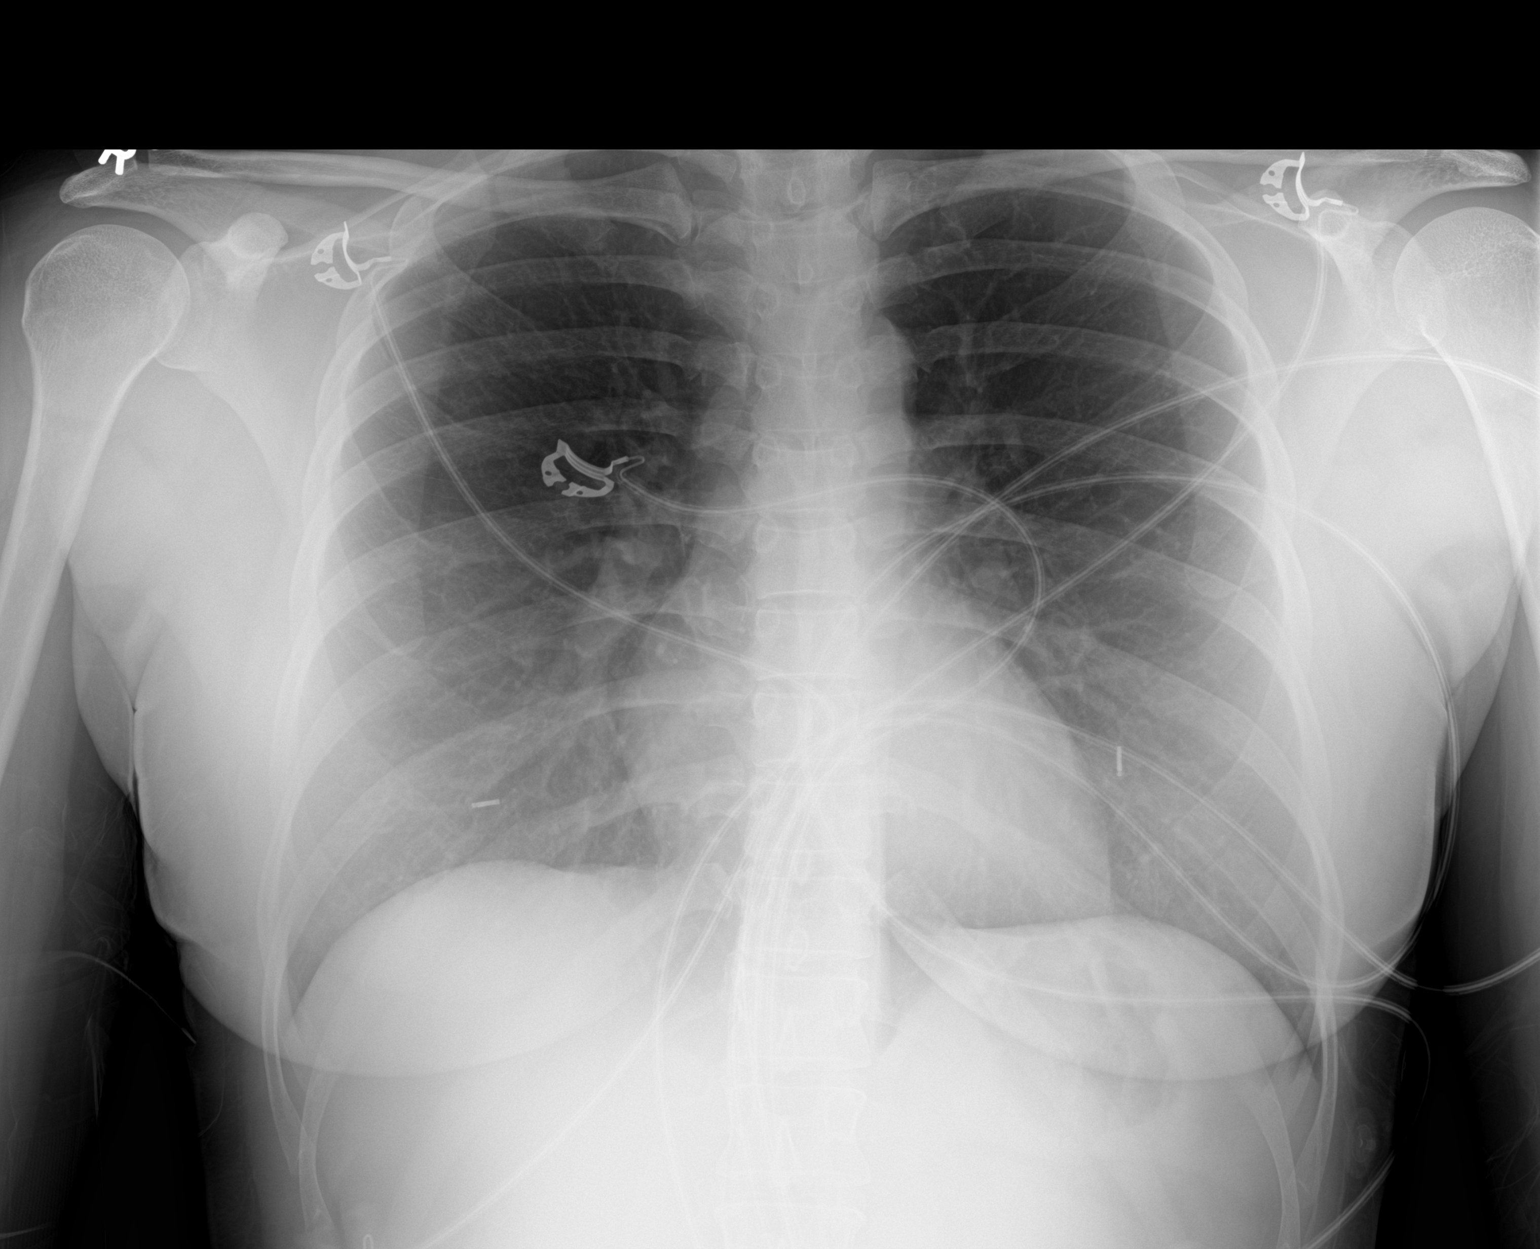

[1 of 1 positions shown; findings below may reference images not displayed]

FINDINGS: The heart size and mediastinal contours are within normal limits. No
focal airspace consolidation. No visible pleural effusion or
pneumothorax. The visualized skeletal structures are unremarkable.
IMPRESSION: No acute cardiopulmonary disease.

## 2021-05-13 IMAGING — MR MR CERVICAL SPINE WO/W CM
5 of 8 series · 19 of 48 positions shown · IV contrast (gadavist)
Comparison: None.

CLINICAL DATA: Acute neurologic deficit

EXAM:
MRI HEAD WITHOUT AND WITH CONTRAST
MRI CERVICAL SPINE WITHOUT AND WITH CONTRAST
TECHNIQUE: Multiplanar, multiecho pulse sequences of the brain and surrounding
structures, and cervical spine, to include the craniocervical
junction and cervicothoracic junction, were obtained without and
with intravenous contrast.
CONTRAST:  6.5mL GADAVIST GADOBUTROL 1 MMOL/ML IV SOLN

[Series 10: T2 · sagittal · 3.0mm · 0.31mm/px · 3 of 16 slices shown (1 of 2)]
[im 1/16]
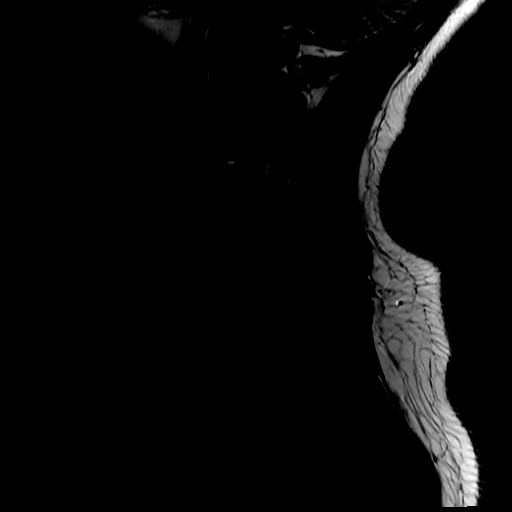
[im 8/16]
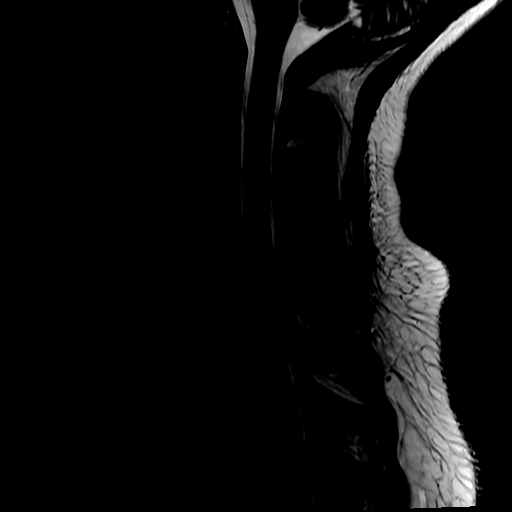
[im 16/16]
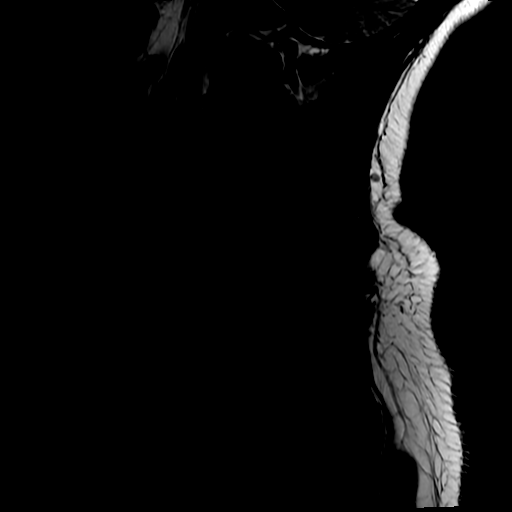

[Series 12: STIR · sagittal · 3.0mm · 0.31mm/px · 1 of 18 slices shown]
[im 1/18]
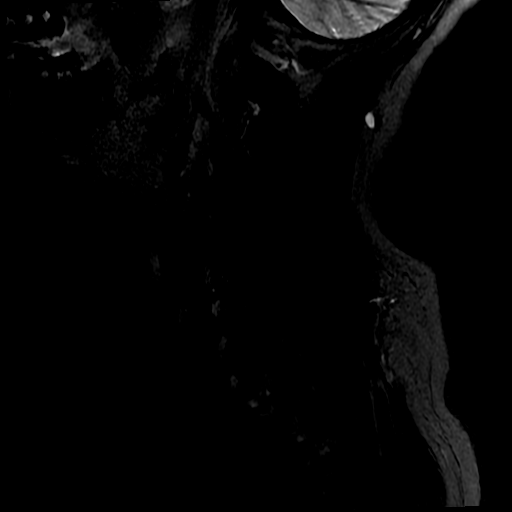

[Series 14: T2 · axial · 3.0mm · 0.35mm/px · z∈[-174,-64]mm · 6 of 35 slices shown (2 of 2)]
[im 1/35]
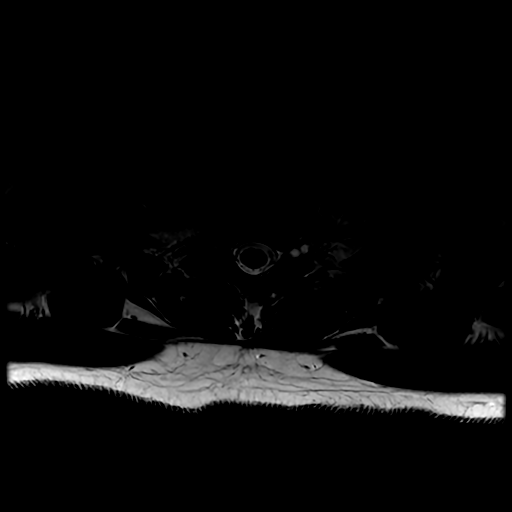
[im 7/35]
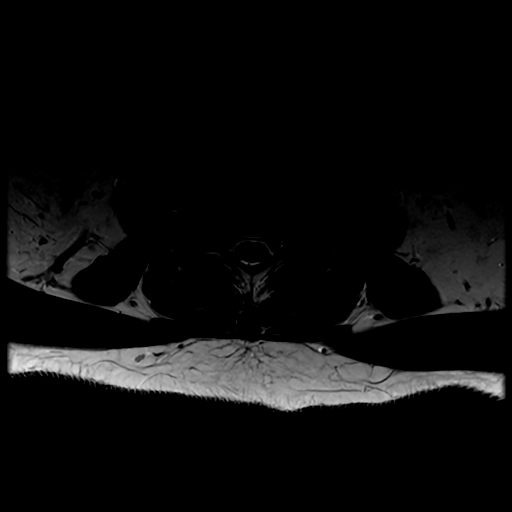
[im 14/35]
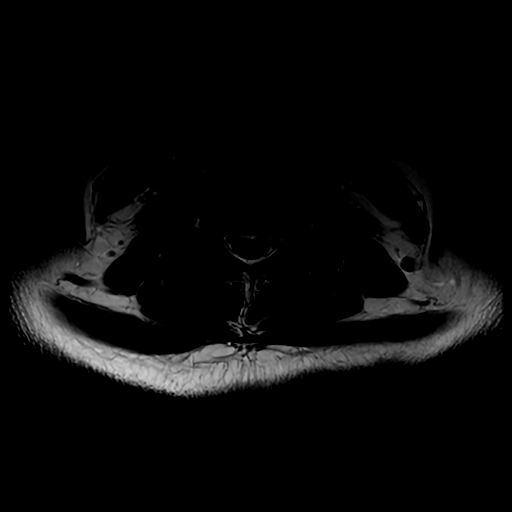
[im 21/35]
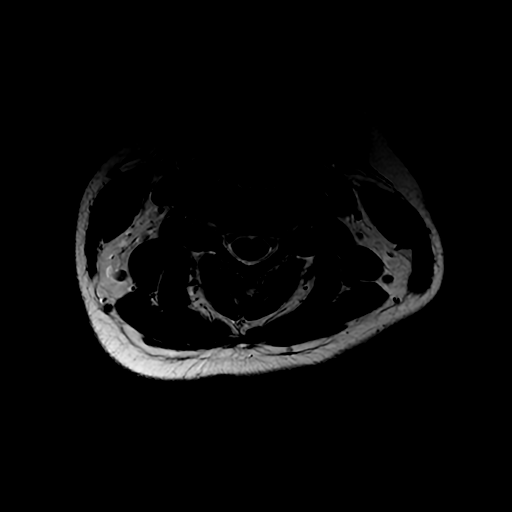
[im 28/35]
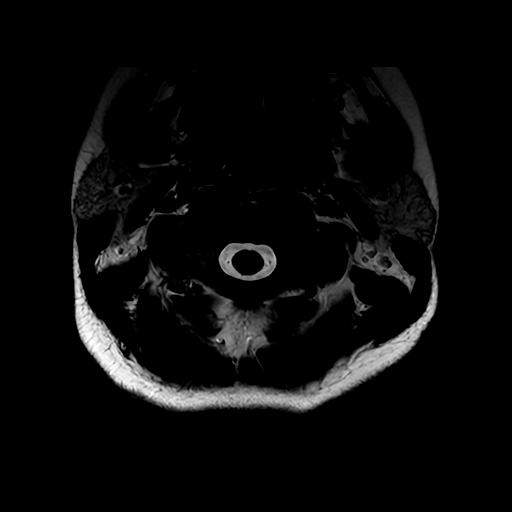
[im 35/35]
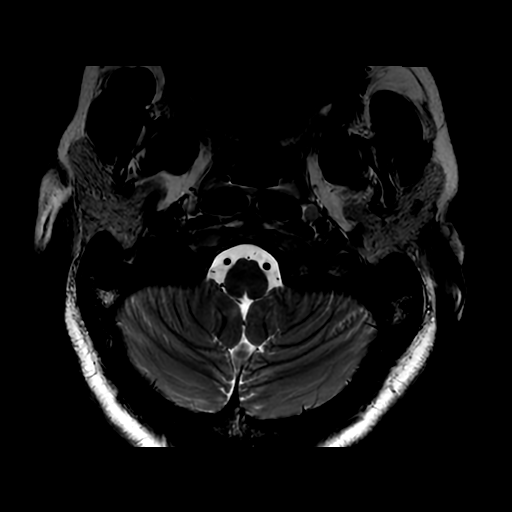

[Series 15: T1 · axial · non-contrast · 3.0mm · 0.35mm/px · z∈[-174,-64]mm · 6 of 35 slices shown]
[im 1/35]
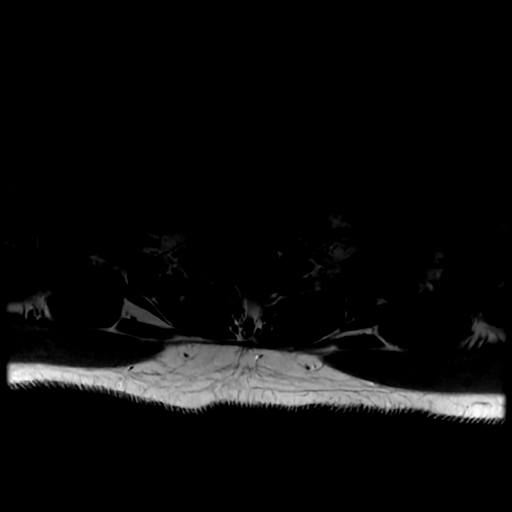
[im 7/35]
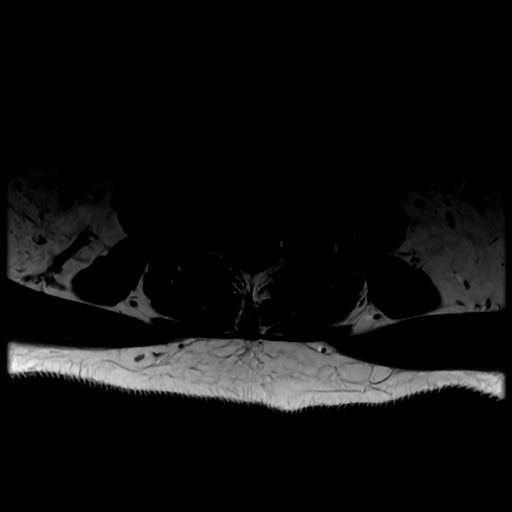
[im 14/35]
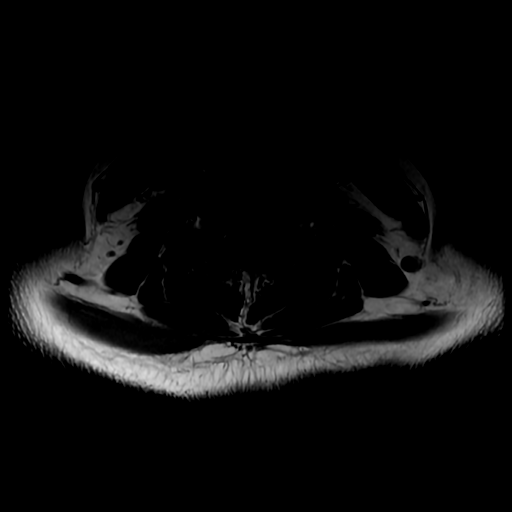
[im 21/35]
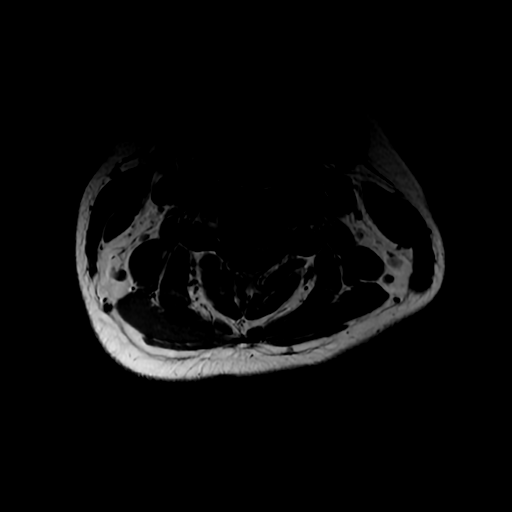
[im 28/35]
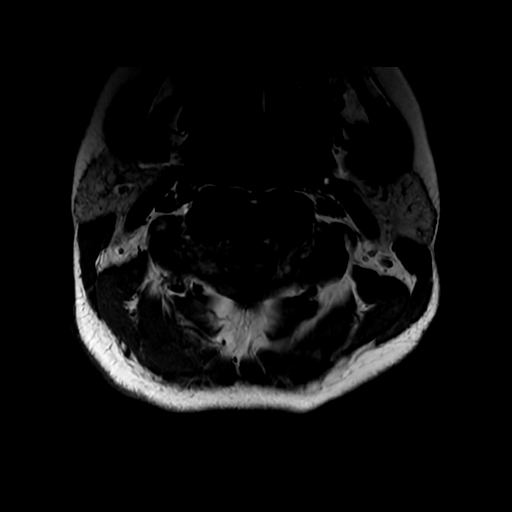
[im 35/35]
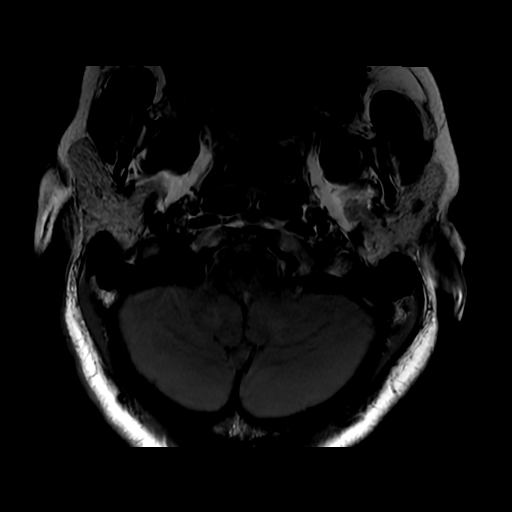

[Series 16: T1 fat-sat post-contrast · sagittal · 3.0mm · 0.31mm/px · 3 of 18 slices shown]
[im 1/18]
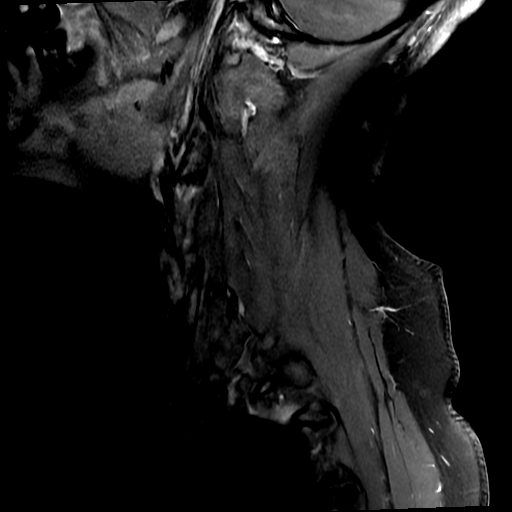
[im 9/18]
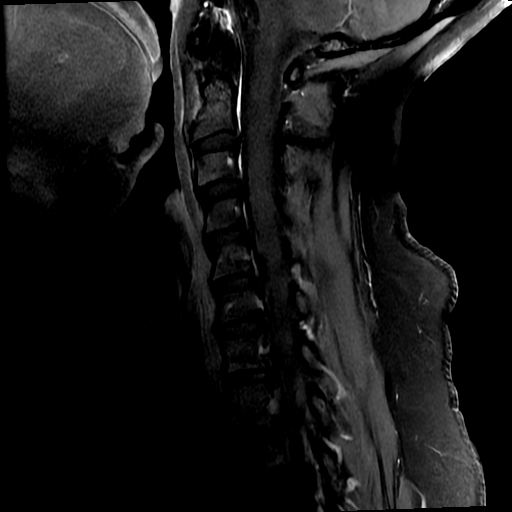
[im 18/18]
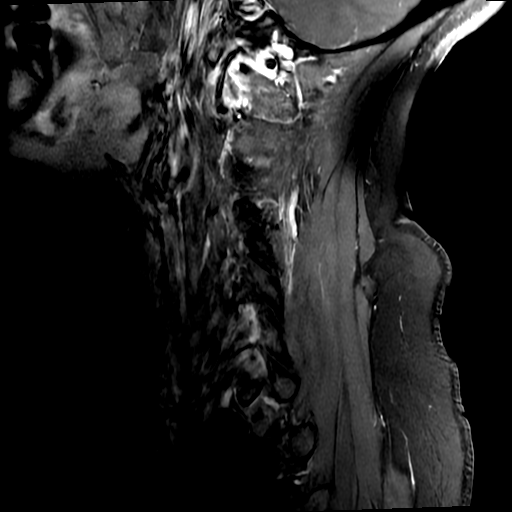

[19 of 48 positions shown; findings below may reference images not displayed]

FINDINGS: MRI HEAD FINDINGS

Brain: No acute infarct, mass effect or extra-axial collection. No
acute or chronic hemorrhage. Normal white matter signal, parenchymal
volume and CSF spaces. The midline structures are normal.

Vascular: Major flow voids are preserved.

Skull and upper cervical spine: Normal calvarium and skull base.
Visualized upper cervical spine and soft tissues are normal.

Sinuses/Orbits:No paranasal sinus fluid levels or advanced mucosal
thickening. No mastoid or middle ear effusion. Normal orbits.

MRI CERVICAL SPINE FINDINGS

Alignment: Physiologic.

Vertebrae: No fracture, evidence of discitis, or bone lesion. C7
hemangioma.

Cord: Normal signal and morphology.

Posterior Fossa, vertebral arteries, paraspinal tissues: Negative.

Disc levels: No disc herniation, spinal canal stenosis or neural
foraminal stenosis.
IMPRESSION: Normal MRI of the brain and cervical spine.

## 2021-05-13 MED ORDER — NAPROXEN 500 MG PO TABS: 500.0000 mg | ORAL_TABLET | Freq: Two times a day (BID) | ORAL | 0 refills | Status: AC

## 2021-05-13 MED ORDER — METHOCARBAMOL 500 MG PO TABS: 500.0000 mg | ORAL_TABLET | Freq: Two times a day (BID) | ORAL | 0 refills | Status: DC

## 2021-05-13 MED ORDER — GADOBUTROL 1 MMOL/ML IV SOLN
6.5000 mL | Freq: Once | INTRAVENOUS | Status: AC | PRN
  Administered 2021-05-13: 6.5 mL via INTRAVENOUS

## 2021-05-13 MED ORDER — LORAZEPAM 1 MG PO TABS
1.0000 mg | ORAL_TABLET | Freq: Once | ORAL | Status: AC
  Administered 2021-05-13: 1 mg via ORAL
  Filled 2021-05-13: qty 1

## 2021-05-13 MED ORDER — LORAZEPAM 2 MG/ML IJ SOLN
1.0000 mg | Freq: Once | INTRAMUSCULAR | Status: AC
  Administered 2021-05-13: 1 mg via INTRAVENOUS
  Filled 2021-05-13: qty 1

## 2021-05-13 NOTE — ED Notes (Signed)
Rn called MRI at 1540 and again at 1630 for ETA, was told "she is on the list." Pt resting in stretcher in no acute distress, denies needs, requested food but encouraged to remain NPO for imaging. Pt verbalized understanding. ?

## 2021-05-13 NOTE — ED Notes (Signed)
Pt to MRI

## 2021-05-13 NOTE — ED Provider Notes (Signed)
Care assumed from Theodis Blaze, PA-C ? ?See her note for full H&P. Per her note, " Christina Garza is a 36 y.o. female.  With no past medical history presents to the emergency department with chest pain. ?  ?She states for the last 3 days she has had left arm pain that radiates up to her left shoulder, neck and back.  She has had associated left-sided chest pressure that is intermittent. States that she noticed pain again last night, and then today felt like she was "losing strength in her left hand." She also endorses palpitations today. States on the way to ED had sharp left arm pain that has abated. Endorses nausea this AM. Denies speech changes, changes to vision, numbness to her extremities, cough or fevers. No recent travel " ? ? ? ?Physical Exam  ?BP 113/86   Pulse 93   Temp 97.7 ?F (36.5 ?C) (Oral)   Resp 19   Ht 5\' 2"  (1.575 m)   Wt 63.5 kg   LMP  (LMP Unknown)   SpO2 100%   BMI 25.61 kg/m?  ? ?Physical Exam ?Vitals and nursing note reviewed.  ?Constitutional:   ?   General: She is not in acute distress. ?   Appearance: She is well-developed.  ?HENT:  ?   Head: Normocephalic and atraumatic.  ?Eyes:  ?   Conjunctiva/sclera: Conjunctivae normal.  ?Cardiovascular:  ?   Rate and Rhythm: Normal rate and regular rhythm.  ?Pulmonary:  ?   Effort: Pulmonary effort is normal.  ?   Breath sounds: Normal breath sounds.  ?Musculoskeletal:     ?   General: Normal range of motion.  ?   Cervical back: Neck supple.  ?Skin: ?   General: Skin is warm and dry.  ?Neurological:  ?   Mental Status: She is alert.  ?   Comments: No facial droop, clear speech. Slightly decreased grip strength to the lue, otherwise strength intact to the bue. Sensation is wnl bilaterally.  ? ? ? ?Procedures  ?Procedures ? ?ED Course / MDM  ?  ?Medical Decision Making ?Amount and/or Complexity of Data Reviewed ?Radiology: ordered. ? ?Risk ?Prescription drug management. ? ? ?9:00 PM CONSULT with Dr. Cheral Marker who feels that no further  neurologic workup is requires . ? ?Reassessed patient.  She states her symptoms are minimal.  She has reproducible tenderness to the left trapezius muscle, left chest and left upper back.  She has minimal weakness just with grip strength of the left upper extremity but otherwise her strength is intact and sensation appears to be intact.  I doubt dissection, PE, other emergent neurologic or cardiopulmonary process at this time that would warrant further work-up or admission to the hospital.  I did discuss obtaining a D-dimer however Patient declines D-dimer at this time which I think is reasonable.    Her symptoms are more intermittent in nature which does not support a diagnosis of PE. I think that her symptoms are more likely related to an MSK cause then we will treat symptomatically with naproxen, Robaxin.  I will give her information to follow-up with the PCP and she was advised to return to ED if she has no improvement of symptoms in the next 1 to 2 days.  She voices understanding the plan and reasons to return.  All questions answered.  Patient stable for discharge. ? ? ?  ?Tivis Ringer, Avary Eichenberger S, PA-C ?05/13/21 2112 ? ?  ?Charlesetta Shanks, MD ?05/14/21 1548 ? ?

## 2021-05-13 NOTE — ED Notes (Signed)
PA and Dr. Denton Lank aware. Will place pt directly to treatment room per Dr. Denton Lank.  ?

## 2021-05-13 NOTE — ED Provider Notes (Signed)
?[facility] EMERGENCY DEPARTMENT ?Provider Note ? ? ?CSN: [id number] ?Arrival date & time: 05/13/21  1142 ? ?  ? ?History ? ?Chief Complaint  ?Patient presents with  ? Chest Pain  ? ? ?Christina Garza is a 36 y.o. female.  With no past medical history presents to the emergency department with chest pain. ? ?She states for the last 3 days she has had left arm pain that radiates up to her left shoulder, neck and back.  She has had associated left-sided chest pressure that is intermittent. States that she noticed pain again last night, and then today felt like she was "losing strength in her left hand." She also endorses palpitations today. States on the way to ED had sharp left arm pain that has abated. Endorses nausea this AM. Denies speech changes, changes to vision, numbness to her extremities, cough or fevers. No recent travel  ? ?Chest Pain ?Associated symptoms: back pain, nausea, palpitations, shortness of breath and weakness   ?Associated symptoms: no abdominal pain, no cough, no fever, no headache, no numbness and no vomiting   ? ?  ? ?Home Medications ?Prior to Admission medications   ?Medication Sig Start Date End Date Taking? Authorizing Provider  ?ibuprofen (ADVIL) 200 MG tablet Take 200 mg by mouth every 6 (six) hours as needed for moderate pain.    [provider]  ?Multiple Vitamins-Minerals (MULTIVITAMIN ADULT EXTRA C) CHEW Chew 2 tablets by mouth daily at 6 (six) AM.    [provider]  ?ondansetron (ZOFRAN-ODT) 4 MG disintegrating tablet Take 1 tablet (4 mg total) by mouth every 8 (eight) hours as needed for nausea or vomiting. 04/10/21   Renne Crigler, PA-C  ?oxyCODONE (OXY IR/ROXICODONE) 5 MG immediate release tablet Take 1 tablet (5 mg total) by mouth every 6 (six) hours as needed for severe pain. 04/10/21   Renne Crigler, PA-C  ?PRESCRIPTION MEDICATION Take 400 mg by mouth once. Sumigran Plus (Ibuprofen/Caffeine) from [place]    [provider]  ?tamsulosin (FLOMAX) 0.4 MG CAPS capsule Take 1 capsule (0.4 mg total) by mouth daily. 04/10/21   Renne Crigler, PA-C  ?   ? ?Allergies    ?Patient has no known allergies.   ? ?Review of Systems   ?Review of Systems  ?Constitutional:  Negative for fever.  ?Respiratory:  Positive for shortness of breath. Negative for cough.   ?Cardiovascular:  Positive for chest pain and palpitations. Negative for leg swelling.  ?Gastrointestinal:  Positive for nausea. Negative for abdominal pain and vomiting.  ?Musculoskeletal:  Positive for back pain.  ?Neurological:  Positive for weakness. Negative for numbness and headaches.  ?All other systems reviewed and are negative. ? ?Physical Exam ?Updated Vital Signs ?BP (!) 135/91 (BP Location: Right Arm)   Pulse 87   Temp 98.6 ?F (37 ?C) (Oral)   Resp 18   LMP  (LMP Unknown)   SpO2 99%  ?Physical Exam ?Vitals and nursing note reviewed.  ?Constitutional:   ?   General: She is not in acute distress. ?   Appearance: Normal appearance. She is well-developed. She is not ill-appearing or toxic-appearing.  ?HENT:  ?   Head: Normocephalic and atraumatic.  ?   Mouth/Throat:  ?   Mouth: Mucous membranes are moist.  ?   Pharynx: Oropharynx is clear.  ?Eyes:  ?   General: No scleral icterus. ?   Extraocular Movements: Extraocular movements intact.  ?   Pupils: Pupils are equal, round, and reactive to  light.  ?Neck:  ?   Vascular: No JVD.  ? ?Cardiovascular:  ?   Rate and Rhythm: Normal rate and regular rhythm.  ?   Pulses:     ?     Radial pulses are 2+ on the right side and 2+ on the left side.  ?     Dorsalis pedis pulses are 1+ on the right side and 1+ on the left side.  ?   Heart sounds: Normal heart sounds. No murmur heard. ?Pulmonary:  ?   Effort: Pulmonary effort is normal. No tachypnea or respiratory distress.  ?   Breath sounds: Normal breath sounds.  ?Chest:  ?   Chest wall: No tenderness.  ?Abdominal:  ?   General: Bowel sounds are normal. There is no distension.  ?    Palpations: Abdomen is soft.  ?   Tenderness: There is no abdominal tenderness.  ?Musculoskeletal:     ?   General: Normal range of motion.  ?   Cervical back: Normal range of motion and neck supple. Tenderness present. Muscular tenderness present.  ?   Right lower leg: No edema.  ?   Left lower leg: No edema.  ?Skin: ?   General: Skin is warm and dry.  ?   Capillary Refill: Capillary refill takes less than 2 seconds.  ?Neurological:  ?   Mental Status: She is alert and oriented to person, place, and time.  ?   GCS: GCS eye subscore is 4. GCS verbal subscore is 5. GCS motor subscore is 6.  ?   Cranial Nerves: No dysarthria or facial asymmetry.  ?   Sensory: Sensory deficit present.  ?   Motor: Weakness present.  ?   Coordination: Coordination is intact.  ?   Comments: Subjective numbness to the forehead on the left side ?Subjective numbness to the left arm ?Strength 4/5 in left upper extremity  ?Psychiatric:     ?   Mood and Affect: Mood normal.     ?   Behavior: Behavior normal.     ?   Thought Content: Thought content normal.     ?   Judgment: Judgment normal.  ? ? ?ED Results / Procedures / Treatments   ?Labs ?(all labs ordered are listed, but only abnormal results are displayed) ?Labs Reviewed  ?BASIC METABOLIC PANEL - Abnormal; Notable for the following components:  ?    Result Value  ? Glucose, Bld 142 (*)   ? All other components within normal limits  ?I-STAT CHEM 8, ED - Abnormal; Notable for the following components:  ? Glucose, Bld 138 (*)   ? All other components within normal limits  ?CBC WITH DIFFERENTIAL/PLATELET  ?I-STAT BETA HCG BLOOD, ED ([facility], [facility], [facility] ONLY)  ?TROPONIN I (HIGH SENSITIVITY)  ?TROPONIN I (HIGH SENSITIVITY)  ? ?EKG ?None ? ?Radiology ?No results found. ? ?Procedures ?Procedures  ? ?Medications Ordered in ED ?Medications  ?LORazepam (ATIVAN) tablet 1 mg (1 mg Oral Given 05/13/21 1352)  ? ?ED Course/ Medical Decision Making/ A&P ?  ?                        ?Medical Decision Making ?Amount  and/or Complexity of Data Reviewed ?Radiology: ordered. ? ?Risk ?Prescription drug management. ? ?This patient presents to the ED for concern of chest pain and numbness, this involves an extensive number of treatment options, and is a complaint that carries with it a high risk of complications and morbidity.  The differential diagnosis includes ACS, intracranial abnormality including MS, dissection, atypical chest pain ? ?Co morbidities that complicate the patient evaluation ?None ? ?Additional history obtained:  ?Additional history obtained from: None ?External records from outside source obtained and reviewed including: Previous primary care visits ? ?EKG: ?EKG: normal EKG, normal sinus rhythm.  ? ?Cardiac Monitoring: ?The patient was maintained on a cardiac monitor.  I personally viewed and interpreted the cardiac monitored which showed an underlying rhythm of: Sinus rhythm ? ?Lab Results: ?I personally ordered, reviewed, and interpreted labs. ?Pertinent results include: ?BMP within normal limits ?CBC within normal limits ?I-STAT beta-hCG negative ?Troponin x2 negative ? ?Imaging Studies ordered:  ?I ordered imaging studies which included x-ray and MRI.  I independently reviewed & interpreted imaging & am in agreement with radiology impression. ?Imaging shows: ?Chest x-ray within normal limits ?MRI brain and cervical spine with and without pending ? ?Medications  ?I ordered medication including Ativan for claustrophobia ?Reevaluation of the patient after medication shows that patient improved ? ?Consultations: ?I requested consultation with the neurologist, Dr. Thomasena Edis,  and discussed lab and imaging findings as well as pertinent plan - they recommend: MRI ? ?ED Course: ?36 year old female who presents emergency department with 3 days of left arm pain, chest pain and now decreased strength in the left arm. ? ?Initially had cardiopulmonary work-up.  Troponin x2 negative, EKG without ischemia or infarction, doubt  ACS. ?Chest x-ray without evidence of pneumonia, no cough or fevers.  Chest x-ray without pneumothorax.  Chest x-ray also without widened mediastinum. ?HEAR Score: 2   ?Not pregnant ?Given the focal neurologic

## 2021-05-13 NOTE — Discharge Instructions (Addendum)

## 2021-05-13 NOTE — ED Triage Notes (Signed)
Pt here with L chest pressure, L arm numbness and palpitations. Chest pressure started approx 3 days ago. L arm numbness started today approx 1 hour ago, along with palpitations.  ?

## 2021-05-13 NOTE — ED Provider Triage Note (Signed)
Emergency Medicine Provider Triage Evaluation Note ? ?Christina Garza , a 36 y.o. female  was evaluated in triage.  Pt complains of gradual onset, constant, achy, substernal chest pain that began 3 days ago. Pt also complains of mild nausea. She states she has been having intermittent pains in her left arm for the past 3 days however today noticed it was numb/weak. She reports LKN about 1 hour ago. Denies left leg weakness, facial droop, speech changes, headache, or any other new/worsening symptoms.  ? ?Review of Systems  ?Positive: + CP, left arm weakness/numbness ?Negative: - SOB, left leg weakness, facial droop, speech changes ? ?Physical Exam  ?BP (!) 150/98 (BP Location: Left Arm)   Pulse (!) 106   Temp 98.6 ?F (37 ?C) (Oral)   Resp 15   SpO2 100%  ?Gen:   Awake, no distress   ?Resp:  Normal effort  ?MSK:   Moves extremities without difficulty  ?Other:  Pt reports decreased subjective sensation to left forehead and jaw. Cheek with normal sensation. Remainder of CN 2-12 intact. Strength 4/5 to LUE. Strength 5/5 to BLEs and RUE. Subjective decreased sensation to left forearm. Speech clear. Normal finger to nose. No drift.  ? ?Medical Decision Making  ?Medically screening exam initiated at 12:11 PM.  Appropriate orders placed.  Ula Nolasco Leretha Pol was informed that the remainder of the evaluation will be completed by another provider, this initial triage assessment does not replace that evaluation, and the importance of remaining in the ED until their evaluation is complete. ? ?Discussed case with Dr. Denton Lank; evaluated EKG; does not recommend calling code stroke at this time. Pt to be placed in a room in the back for further eval.  ?  ?Tanda Rockers, PA-C ?05/13/21 1215 ? ?

## 2022-04-25 DIAGNOSIS — Z1151 Encounter for screening for human papillomavirus (HPV): Secondary | ICD-10-CM | POA: Diagnosis not present

## 2022-04-25 DIAGNOSIS — N920 Excessive and frequent menstruation with regular cycle: Secondary | ICD-10-CM | POA: Diagnosis not present

## 2022-04-25 DIAGNOSIS — Z1331 Encounter for screening for depression: Secondary | ICD-10-CM | POA: Diagnosis not present

## 2022-04-25 DIAGNOSIS — Z01419 Encounter for gynecological examination (general) (routine) without abnormal findings: Secondary | ICD-10-CM | POA: Diagnosis not present

## 2022-04-29 LAB — HM PAP SMEAR: HM Pap smear: NEGATIVE

## 2022-04-29 LAB — CBC AND DIFFERENTIAL: Hemoglobin: 13.8 (ref 12.0–16.0)

## 2022-11-25 DIAGNOSIS — Z1389 Encounter for screening for other disorder: Secondary | ICD-10-CM | POA: Diagnosis not present

## 2022-11-25 DIAGNOSIS — N920 Excessive and frequent menstruation with regular cycle: Secondary | ICD-10-CM | POA: Diagnosis not present

## 2022-11-25 DIAGNOSIS — R5383 Other fatigue: Secondary | ICD-10-CM | POA: Diagnosis not present

## 2022-11-25 DIAGNOSIS — Z1331 Encounter for screening for depression: Secondary | ICD-10-CM | POA: Diagnosis not present

## 2022-11-25 DIAGNOSIS — R3589 Other polyuria: Secondary | ICD-10-CM | POA: Diagnosis not present

## 2022-12-01 DIAGNOSIS — H8110 Benign paroxysmal vertigo, unspecified ear: Secondary | ICD-10-CM | POA: Diagnosis not present

## 2022-12-02 DIAGNOSIS — D75839 Thrombocytosis, unspecified: Secondary | ICD-10-CM | POA: Diagnosis not present

## 2023-01-05 ENCOUNTER — Encounter: Payer: Self-pay | Admitting: Internal Medicine

## 2023-01-05 ENCOUNTER — Ambulatory Visit (INDEPENDENT_AMBULATORY_CARE_PROVIDER_SITE_OTHER): Payer: BC Managed Care – PPO | Admitting: Internal Medicine

## 2023-01-05 VITALS — BP 102/68 | HR 88 | Temp 98.2°F | Ht 62.0 in | Wt 143.9 lb

## 2023-01-05 DIAGNOSIS — N809 Endometriosis, unspecified: Secondary | ICD-10-CM | POA: Diagnosis not present

## 2023-01-05 DIAGNOSIS — F419 Anxiety disorder, unspecified: Secondary | ICD-10-CM | POA: Diagnosis not present

## 2023-01-05 NOTE — Progress Notes (Signed)
    New Patient Office Visit     CC/Reason for Visit: Establish care, discuss chronic and acute concerns Previous PCP: None Last Visit: Unknown  HPI: Christina Garza is a 37 y.o. female who is coming in today for the above mentioned reasons. Past Medical History is significant for: Endometriosis determined by laparoscopy followed by Dr. Lauro Regulus, OB/GYN in [place].  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  Does not smoke or drink, no known drug allergies, past surgical history is only significant for laparoscopy, no family history of significance.  She is due for flu and Tdap vaccines.  She has been having a lot of anxiety.  She states this anxiety manifests itself with work stress and loud noises in the form of abdominal bloating.  The abdominal bloating happens only under these instances.  No nausea, belching, reflux or other dyspepsia signs.   Past Medical/Surgical History: Past Medical History:  Diagnosis Date   Anxiety    Endometriosis determined by laparoscopy     Past Surgical History:  Procedure Laterality Date   LAPAROSCOPY      Social History:  reports that she has never smoked. She has never used smokeless tobacco. She reports that she does not currently use alcohol. She reports that she does not currently use drugs.  Allergies: No Known Allergies  Family History:  History reviewed. No pertinent family history.   Current Outpatient Medications:    Multiple Vitamins-Minerals (MULTIVITAMIN ADULT EXTRA C) CHEW, Chew 2 tablets by mouth daily at 6 (six) AM., Disp: , Rfl:   Review of Systems:  Negative except as indicated in HPI.   Physical Exam: Vitals:   01/05/23 0906  BP: 102/68  Pulse: 88  Temp: 98.2 F (36.8 C)  TempSrc: Oral  SpO2: 98%  Weight: 143 lb 14.4 oz (65.3 kg)  Height: 5\' 2"  (1.575 m)   Body mass index is 26.32 kg/m.  Physical Exam Vitals reviewed.  Constitutional:      Appearance: Normal appearance.  HENT:      Head: Normocephalic and atraumatic.  Eyes:     Conjunctiva/sclera: Conjunctivae normal.     Pupils: Pupils are equal, round, and reactive to light.  Cardiovascular:     Rate and Rhythm: Normal rate and regular rhythm.  Pulmonary:     Effort: Pulmonary effort is normal.     Breath sounds: Normal breath sounds.  Skin:    General: Skin is warm and dry.  Neurological:     General: No focal deficit present.     Mental Status: She is alert and oriented to person, place, and time.  Psychiatric:        Mood and Affect: Mood normal.        Behavior: Behavior normal.        Thought Content: Thought content normal.        Judgment: Judgment normal.       Impression and Plan:  Anxiety  Endometriosis determined by laparoscopy    -We discussed CBT for anxiety which she is agreeable to.  Information given on how to schedule.  She will schedule return visit for annual physical.  She declines flu vaccine today.    Time spent: 46 minutes reviewing chart, interviewing and examining patient and formulating plan of care.    Chaya Jan, MD [facility]

## 2023-01-06 ENCOUNTER — Telehealth: Payer: Self-pay | Admitting: *Deleted

## 2023-01-06 ENCOUNTER — Ambulatory Visit: Payer: BC Managed Care – PPO

## 2023-01-06 NOTE — Telephone Encounter (Signed)
Patient was scheduled as a nurse's visit today for an HPV.  After reviewing the chart, I did not see any notes or information in the office note from 01/05/2023 with approval for the vaccine.  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  I apologized for the inconvenience and advised the patient a message will be sent to the PCP for review when she returns to the office on Monday.  Patient requested to be called at [phone].

## 2023-01-10 ENCOUNTER — Ambulatory Visit: Payer: Self-pay | Admitting: Internal Medicine

## 2023-01-10 NOTE — Telephone Encounter (Signed)
Left detailed message on machine for patient to call and schedule a HPV vaccine.

## 2023-01-11 ENCOUNTER — Encounter: Payer: Self-pay | Admitting: Internal Medicine

## 2023-01-12 ENCOUNTER — Ambulatory Visit (INDEPENDENT_AMBULATORY_CARE_PROVIDER_SITE_OTHER): Payer: BC Managed Care – PPO | Admitting: *Deleted

## 2023-01-12 ENCOUNTER — Telehealth: Payer: Self-pay | Admitting: *Deleted

## 2023-01-12 DIAGNOSIS — Z23 Encounter for immunization: Secondary | ICD-10-CM

## 2023-01-12 NOTE — Telephone Encounter (Signed)
Patient here for a HPV vaccine.  Patient would like for Dr Ardyth Harps to take a look at a CT renal 04/10/21 results.  She believes that she has a hernia. She is also concerned with her lungs. Please advise.

## 2023-01-12 NOTE — Telephone Encounter (Signed)
Patient is aware.  Appointment scheduled. °

## 2023-01-17 ENCOUNTER — Ambulatory Visit: Payer: BC Managed Care – PPO | Admitting: Internal Medicine

## 2023-01-17 ENCOUNTER — Encounter: Payer: Self-pay | Admitting: Internal Medicine

## 2023-01-17 VITALS — BP 110/80 | HR 70 | Temp 98.2°F | Wt 140.8 lb

## 2023-01-17 DIAGNOSIS — N83201 Unspecified ovarian cyst, right side: Secondary | ICD-10-CM

## 2023-01-17 NOTE — Progress Notes (Signed)
     Established Patient Office Visit     CC/Reason for Visit: Discuss results from CT scan from February 2023  HPI: Christina Garza is a 37 y.o. female who is coming in today for the above mentioned reasons.  She just activated MyChart and was able to see the results from a CT renal stone scan that was done in February 2023.  She wanted to discuss some of these findings with me.  The 2 main findings where a right moderate hydroureteronephrosis with a 3 mm obstructing calculus as well as a large cystic structure in the right adnexa measuring 7.3 cm for which an ultrasound was recommended.  She did end up following with GYN and the cystic structure did appear resolved on follow-up ultrasound.  She never had any follow-up for her hydroureteronephrosis but pain resolved a few days after the scan was completed.   Past Medical/Surgical History: Past Medical History:  Diagnosis Date   Anxiety    Endometriosis determined by laparoscopy     Past Surgical History:  Procedure Laterality Date   LAPAROSCOPY      Social History:  reports that she has never smoked. She has never used smokeless tobacco. She reports that she does not currently use alcohol. She reports that she does not currently use drugs.  Allergies: No Known Allergies  Family History:  History reviewed. No pertinent family history.   Current Outpatient Medications:    Multiple Vitamins-Minerals (MULTIVITAMIN ADULT EXTRA C) CHEW, Chew 2 tablets by mouth daily at 6 (six) AM., Disp: , Rfl:   Review of Systems:  Negative unless indicated in HPI.   Physical Exam: Vitals:   01/17/23 0903  BP: 110/80  Pulse: 70  Temp: 98.2 F (36.8 C)  TempSrc: Oral  SpO2: 98%  Weight: 140 lb 12.8 oz (63.9 kg)    Body mass index is 25.75 kg/m.   Physical Exam Vitals reviewed.  Constitutional:      Appearance: Normal appearance.  HENT:     Head: Normocephalic and atraumatic.  Eyes:     Conjunctiva/sclera:  Conjunctivae normal.     Pupils: Pupils are equal, round, and reactive to light.  Skin:    General: Skin is warm and dry.  Neurological:     General: No focal deficit present.     Mental Status: She is alert and oriented to person, place, and time.  Psychiatric:        Mood and Affect: Mood normal.        Behavior: Behavior normal.        Thought Content: Thought content normal.        Judgment: Judgment normal.      Impression and Plan:  Right ovarian cyst   -The right adnexal cyst was already evaluated by GYN last year.  She had a follow-up ultrasound that showed resolution of it. -A couple days after the CT scan was done her flank pain resolved, I think she had a temporary obstructing stone that passed, do not believe further workup is necessary.  Time spent:22 minutes reviewing chart, interviewing and examining patient and formulating plan of care.     Chaya Jan, MD [facility]

## 2023-03-17 ENCOUNTER — Ambulatory Visit (INDEPENDENT_AMBULATORY_CARE_PROVIDER_SITE_OTHER): Payer: BC Managed Care – PPO

## 2023-03-17 DIAGNOSIS — Z23 Encounter for immunization: Secondary | ICD-10-CM

## 2023-03-17 NOTE — Progress Notes (Signed)
Pt came in for 2nd dose of HPV. Pt reports she had some swelling a day after previous injection on L arm. Swelling on interior L arm, axillary area and toward her L breast. Pt states the sx went away on its own after a week.   Schedule pt for 3rd dose shot and advise pt to keep [place] updated on the side effect. Pt verbalized understanding.

## 2023-03-24 DIAGNOSIS — G4733 Obstructive sleep apnea (adult) (pediatric): Secondary | ICD-10-CM | POA: Diagnosis not present

## 2023-03-30 DIAGNOSIS — G4733 Obstructive sleep apnea (adult) (pediatric): Secondary | ICD-10-CM | POA: Diagnosis not present

## 2023-04-07 DIAGNOSIS — G4733 Obstructive sleep apnea (adult) (pediatric): Secondary | ICD-10-CM | POA: Diagnosis not present

## 2023-04-27 DIAGNOSIS — Z6826 Body mass index (BMI) 26.0-26.9, adult: Secondary | ICD-10-CM | POA: Diagnosis not present

## 2023-04-27 DIAGNOSIS — Z3141 Encounter for fertility testing: Secondary | ICD-10-CM | POA: Diagnosis not present

## 2023-04-27 DIAGNOSIS — Z01419 Encounter for gynecological examination (general) (routine) without abnormal findings: Secondary | ICD-10-CM | POA: Diagnosis not present

## 2023-04-27 DIAGNOSIS — Z1331 Encounter for screening for depression: Secondary | ICD-10-CM | POA: Diagnosis not present

## 2023-04-27 DIAGNOSIS — Z1151 Encounter for screening for human papillomavirus (HPV): Secondary | ICD-10-CM | POA: Diagnosis not present

## 2023-05-09 DIAGNOSIS — R1903 Right lower quadrant abdominal swelling, mass and lump: Secondary | ICD-10-CM | POA: Diagnosis not present

## 2023-05-12 DIAGNOSIS — N978 Female infertility of other origin: Secondary | ICD-10-CM | POA: Diagnosis not present

## 2023-05-12 DIAGNOSIS — Z3161 Procreative counseling and advice using natural family planning: Secondary | ICD-10-CM | POA: Diagnosis not present

## 2023-05-30 DIAGNOSIS — Z130 Encounter for screening for diseases of the blood and blood-forming organs and certain disorders involving the immune mechanism: Secondary | ICD-10-CM | POA: Diagnosis not present

## 2023-05-30 DIAGNOSIS — Z3141 Encounter for fertility testing: Secondary | ICD-10-CM | POA: Diagnosis not present

## 2023-05-30 DIAGNOSIS — E1142 Type 2 diabetes mellitus with diabetic polyneuropathy: Secondary | ICD-10-CM | POA: Diagnosis not present

## 2023-05-30 DIAGNOSIS — Z0183 Encounter for blood typing: Secondary | ICD-10-CM | POA: Diagnosis not present

## 2023-05-30 DIAGNOSIS — Z113 Encounter for screening for infections with a predominantly sexual mode of transmission: Secondary | ICD-10-CM | POA: Diagnosis not present

## 2023-05-30 DIAGNOSIS — Z1159 Encounter for screening for other viral diseases: Secondary | ICD-10-CM | POA: Diagnosis not present

## 2023-06-05 DIAGNOSIS — Z3141 Encounter for fertility testing: Secondary | ICD-10-CM | POA: Diagnosis not present

## 2023-06-08 DIAGNOSIS — Z3141 Encounter for fertility testing: Secondary | ICD-10-CM | POA: Diagnosis not present

## 2023-06-23 DIAGNOSIS — Z713 Dietary counseling and surveillance: Secondary | ICD-10-CM | POA: Diagnosis not present

## 2023-07-21 ENCOUNTER — Ambulatory Visit: Payer: BC Managed Care – PPO

## 2023-07-26 ENCOUNTER — Ambulatory Visit (INDEPENDENT_AMBULATORY_CARE_PROVIDER_SITE_OTHER): Admitting: *Deleted

## 2023-07-26 DIAGNOSIS — Z3189 Encounter for other procreative management: Secondary | ICD-10-CM | POA: Diagnosis not present

## 2023-07-26 DIAGNOSIS — Z23 Encounter for immunization: Secondary | ICD-10-CM | POA: Diagnosis not present

## 2023-09-07 DIAGNOSIS — Z3189 Encounter for other procreative management: Secondary | ICD-10-CM | POA: Diagnosis not present

## 2023-09-15 DIAGNOSIS — Z3189 Encounter for other procreative management: Secondary | ICD-10-CM | POA: Diagnosis not present

## 2023-09-18 DIAGNOSIS — Z3189 Encounter for other procreative management: Secondary | ICD-10-CM | POA: Diagnosis not present

## 2023-10-26 ENCOUNTER — Telehealth: Payer: Self-pay | Admitting: Internal Medicine

## 2023-10-26 NOTE — Telephone Encounter (Signed)
 Copied from CRM [id number]. Topic: Referral - Request for Referral >> Oct 26, 2023  9:27 AM Christina Garza wrote: Did the patient discuss referral with their provider in the last year? No  (If No - schedule appointment) (If Yes - send message)  Appointment offered? Yes  Type of order/referral and detailed reason for visit: Gastro issues   Preference of office, provider, location:  [facility] [address] [place],  [place]  [address]  Main: [phone]  Fax : [phone]  If referral order, have you been seen by this specialty before? Yes  (If Yes, this issue or another issue? When? Where?  Can we respond through MyChart? Yes

## 2023-10-30 NOTE — Telephone Encounter (Signed)
 Spoke to the patient and an appointment was scheduled.

## 2023-11-01 ENCOUNTER — Encounter: Payer: Self-pay | Admitting: Internal Medicine

## 2023-11-01 ENCOUNTER — Ambulatory Visit (INDEPENDENT_AMBULATORY_CARE_PROVIDER_SITE_OTHER): Admitting: Internal Medicine

## 2023-11-01 VITALS — BP 102/80 | HR 76 | Temp 98.3°F | Wt 140.0 lb

## 2023-11-01 DIAGNOSIS — K219 Gastro-esophageal reflux disease without esophagitis: Secondary | ICD-10-CM | POA: Diagnosis not present

## 2023-11-01 MED ORDER — PANTOPRAZOLE SODIUM 40 MG PO TBEC: 40.0000 mg | DELAYED_RELEASE_TABLET | Freq: Every day | ORAL | 1 refills | Status: AC

## 2023-11-01 NOTE — Progress Notes (Signed)
     Established Patient Office Visit     CC/Reason for Visit: Discuss reflux  HPI: Christina Garza is a 38 y.o. female who is coming in today for the above mentioned reasons.  For the past 4 weeks or so she has been experiencing reflux.  She describes this as burning up in her neck area, worse when lying down at night.  She has tried Tums and Alka-Seltzer without much relief.   Past Medical/Surgical History: Past Medical History:  Diagnosis Date   Anxiety    Endometriosis determined by laparoscopy     Past Surgical History:  Procedure Laterality Date   LAPAROSCOPY      Social History:  reports that she has never smoked. She has never used smokeless tobacco. She reports that she does not currently use alcohol . She reports that she does not currently use drugs.  Allergies: No Known Allergies  Family History:  History reviewed. No pertinent family history.   Current Outpatient Medications:    Multiple Vitamins-Minerals (MULTIVITAMIN ADULT EXTRA C) CHEW, Chew 2 tablets by mouth daily at 6 (six) AM., Disp: , Rfl:    pantoprazole  (PROTONIX ) 40 MG tablet, Take 1 tablet (40 mg total) by mouth daily., Disp: 90 tablet, Rfl: 1  Review of Systems:  Negative unless indicated in HPI.   Physical Exam: Vitals:   11/01/23 0933  BP: 102/80  Pulse: 76  Temp: 98.3 F (36.8 C)  TempSrc: Oral  SpO2: 98%  Weight: 140 lb (63.5 kg)    Body mass index is 25.61 kg/m.    Impression and Plan:  Gastroesophageal reflux disease, unspecified whether esophagitis present -     Pantoprazole  Sodium; Take 1 tablet (40 mg total) by mouth daily.  Dispense: 90 tablet; Refill: 1   - Start PPI therapy daily, Protonix  40 mg.  If no improvement in 4 to 6 weeks, consider GI referral.   Time spent:30 minutes reviewing chart, interviewing and examining patient and formulating plan of care.     Tully Theophilus Andrews, MD [facility]
